# Patient Record
Sex: Male | Born: 1960
Health system: Southern US, Community
[De-identification: ages and names within clinical notes are randomized; demographics above are authoritative.]

## PROBLEM LIST (undated history)

## (undated) DIAGNOSIS — L80 Vitiligo: Secondary | ICD-10-CM

## (undated) DIAGNOSIS — M545 Low back pain: Secondary | ICD-10-CM

## (undated) DIAGNOSIS — K921 Melena: Secondary | ICD-10-CM

## (undated) DIAGNOSIS — R972 Elevated prostate specific antigen [PSA]: Secondary | ICD-10-CM

## (undated) DIAGNOSIS — E7800 Pure hypercholesterolemia, unspecified: Secondary | ICD-10-CM

## (undated) DIAGNOSIS — Z87898 Personal history of other specified conditions: Secondary | ICD-10-CM

## (undated) DIAGNOSIS — L409 Psoriasis, unspecified: Secondary | ICD-10-CM

## (undated) DIAGNOSIS — J309 Allergic rhinitis, unspecified: Secondary | ICD-10-CM

## (undated) DIAGNOSIS — F419 Anxiety disorder, unspecified: Secondary | ICD-10-CM

## (undated) DIAGNOSIS — I10 Essential (primary) hypertension: Secondary | ICD-10-CM

## (undated) DIAGNOSIS — G4700 Insomnia, unspecified: Secondary | ICD-10-CM

## (undated) DIAGNOSIS — Z5189 Encounter for other specified aftercare: Secondary | ICD-10-CM

## (undated) DIAGNOSIS — K219 Gastro-esophageal reflux disease without esophagitis: Secondary | ICD-10-CM

## (undated) DIAGNOSIS — Z87442 Personal history of urinary calculi: Secondary | ICD-10-CM

## (undated) DIAGNOSIS — M1990 Unspecified osteoarthritis, unspecified site: Secondary | ICD-10-CM

## (undated) DIAGNOSIS — I471 Supraventricular tachycardia, unspecified: Secondary | ICD-10-CM

## (undated) DIAGNOSIS — N182 Chronic kidney disease, stage 2 (mild): Secondary | ICD-10-CM

## (undated) HISTORY — PX: SPERMATOCELECTOMY: SHX2420

## (undated) HISTORY — PX: TRANSTHORACIC ECHOCARDIOGRAM: SHX275

## (undated) HISTORY — PX: OTHER SURGICAL HISTORY: SHX169

## (undated) HISTORY — DX: Allergic rhinitis, unspecified: J30.9

## (undated) HISTORY — DX: Personal history of other specified conditions: Z87.898

## (undated) HISTORY — DX: Anxiety disorder, unspecified: F41.9

## (undated) HISTORY — DX: Gastro-esophageal reflux disease without esophagitis: K21.9

## (undated) HISTORY — PX: COLONOSCOPY: SHX174

## (undated) HISTORY — DX: Unspecified osteoarthritis, unspecified site: M19.90

## (undated) HISTORY — DX: Pure hypercholesterolemia, unspecified: E78.00

## (undated) HISTORY — DX: Melena: K92.1

## (undated) HISTORY — DX: Supraventricular tachycardia, unspecified: I47.10

## (undated) HISTORY — DX: Insomnia, unspecified: G47.00

## (undated) HISTORY — DX: Chronic kidney disease, stage 2 (mild): N18.2

## (undated) HISTORY — DX: Psoriasis, unspecified: L40.9

## (undated) HISTORY — DX: Vitiligo: L80

## (undated) HISTORY — DX: Elevated prostate specific antigen (PSA): R97.20

## (undated) HISTORY — DX: Personal history of urinary calculi: Z87.442

## (undated) HISTORY — PX: GANGLION CYST EXCISION: SHX1691

## (undated) HISTORY — DX: Essential (primary) hypertension: I10

## (undated) HISTORY — DX: Encounter for other specified aftercare: Z51.89

## (undated) HISTORY — DX: Low back pain: M54.5

---

## 2006-08-23 ENCOUNTER — Emergency Department (HOSPITAL_COMMUNITY): Admission: EM | Admit: 2006-08-23 | Discharge: 2006-08-23 | Payer: Self-pay | Admitting: Emergency Medicine

## 2006-08-25 ENCOUNTER — Ambulatory Visit (HOSPITAL_COMMUNITY): Admission: AD | Admit: 2006-08-25 | Discharge: 2006-08-25 | Payer: Self-pay | Admitting: Urology

## 2007-01-02 IMAGING — CR DG ABDOMEN 1V
2 series · 2 of 2 positions shown · non-contrast
Comparison: none

HISTORY: Proximal left ureteral calculus

[view not recorded (1 of 2)]
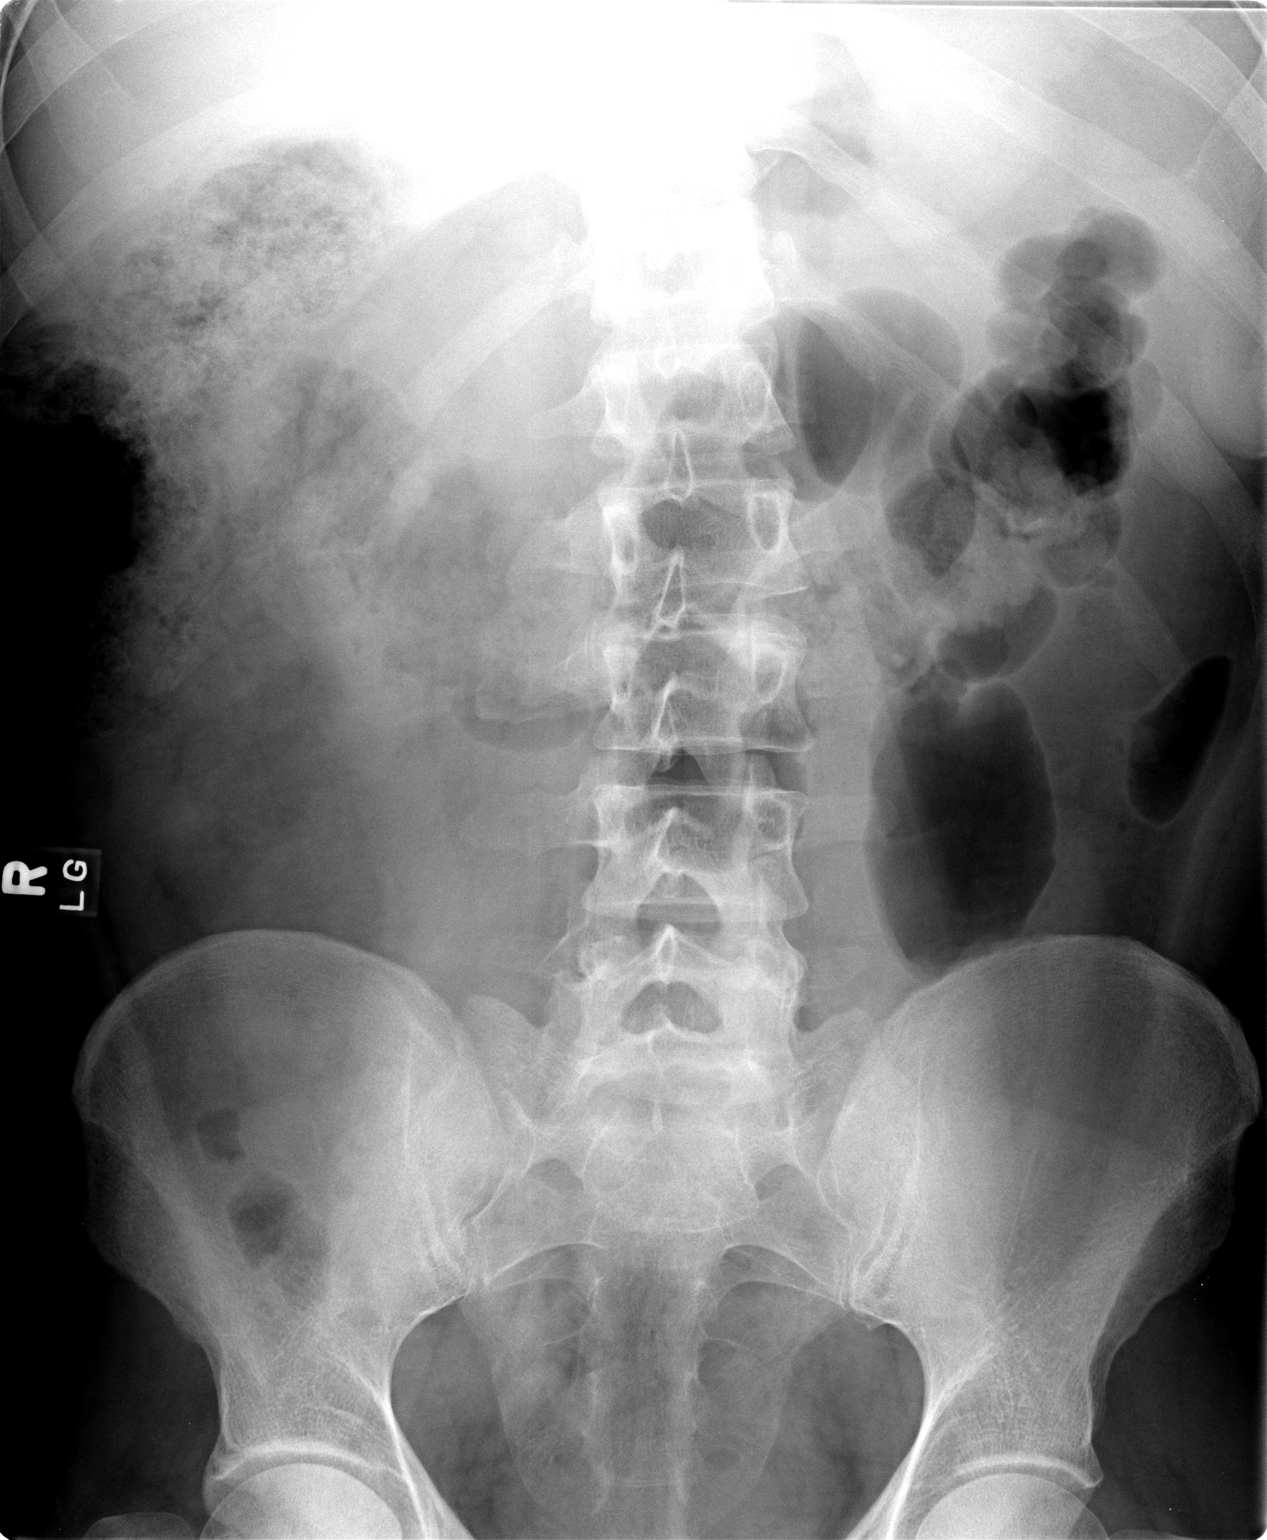

[view not recorded (2 of 2)]
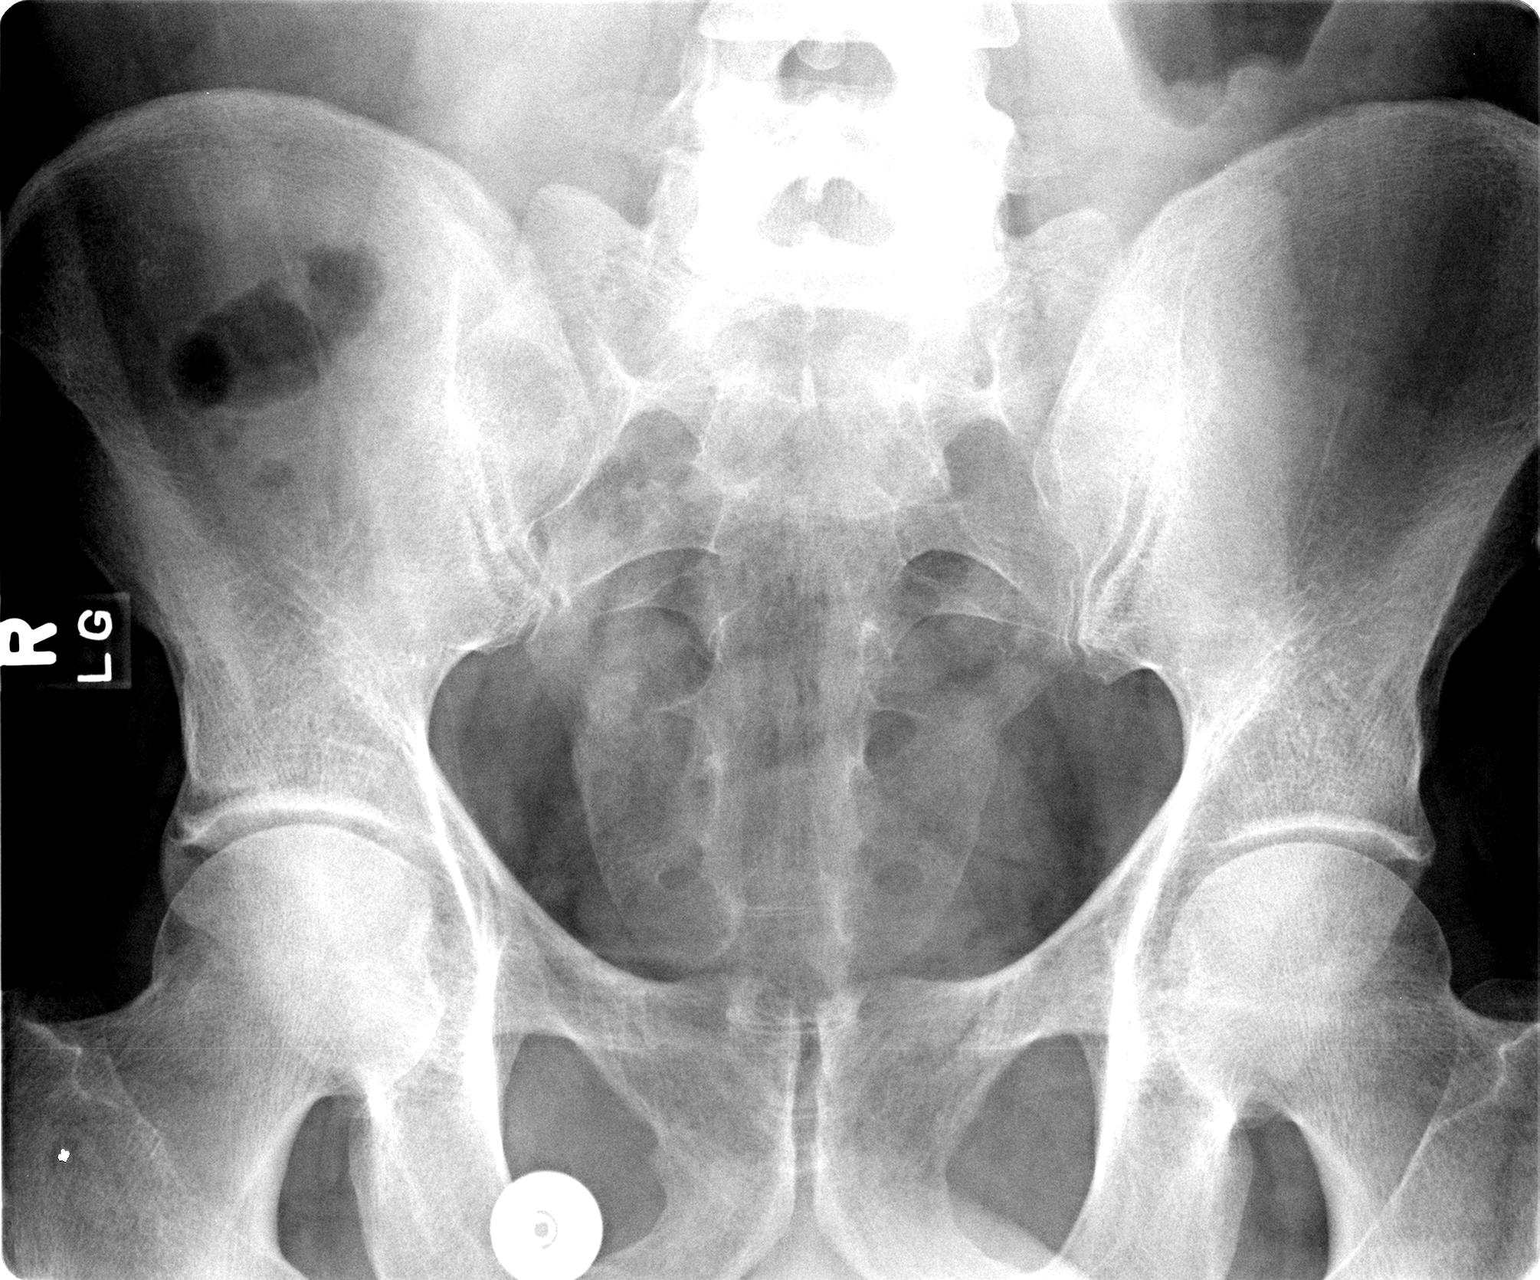

[2 of 2 positions shown; findings below may reference images not displayed]

ABDOMEN ONE VIEW:

3 mm diameter calculus identified adjacent to left transverse process L3
compatible proximal ureteral calculus.
No additional urinary tract calcifications.
Increased stool in proximal half of colon projects over both kidneys.
Minimal broad-based levoconvex scoliosis.
Bowel gas pattern normal.
IMPRESSION: 3 mm diameter left paraspinal calcification at L3 likely proximal left ureteral
calculus.
This roughly corresponds in size and position with calculus identified by
preceding CT of 08/23/2006.

## 2008-06-23 ENCOUNTER — Ambulatory Visit: Payer: Self-pay | Admitting: Internal Medicine

## 2008-06-23 DIAGNOSIS — J309 Allergic rhinitis, unspecified: Secondary | ICD-10-CM | POA: Insufficient documentation

## 2008-06-23 DIAGNOSIS — M25519 Pain in unspecified shoulder: Secondary | ICD-10-CM | POA: Insufficient documentation

## 2008-06-23 DIAGNOSIS — Z87442 Personal history of urinary calculi: Secondary | ICD-10-CM

## 2008-06-23 HISTORY — DX: Personal history of urinary calculi: Z87.442

## 2008-06-23 HISTORY — DX: Allergic rhinitis, unspecified: J30.9

## 2008-06-30 ENCOUNTER — Ambulatory Visit: Payer: Self-pay | Admitting: Internal Medicine

## 2008-06-30 LAB — CONVERTED CEMR LAB
Albumin: 3.9 g/dL (ref 3.5–5.2)
BUN: 15 mg/dL (ref 6–23)
Basophils Relative: 0 % (ref 0.0–3.0)
Cholesterol: 159 mg/dL (ref 0–200)
Creatinine, Ser: 1.2 mg/dL (ref 0.4–1.5)
Eosinophils Absolute: 0.1 10*3/uL (ref 0.0–0.7)
Eosinophils Relative: 2 % (ref 0.0–5.0)
GFR calc Af Amer: 83 mL/min
GFR calc non Af Amer: 69 mL/min
HCT: 45.9 % (ref 39.0–52.0)
HDL: 27.9 mg/dL — ABNORMAL LOW (ref >39.0)
MCHC: 34.6 g/dL (ref 30.0–36.0)
MCV: 88.7 fL (ref 78.0–100.0)
Monocytes Absolute: 0.4 10*3/uL (ref 0.1–1.0)
Platelets: 148 10*3/uL — ABNORMAL LOW (ref 150–400)
RBC: 5.17 M/uL (ref 4.22–5.81)
Specific Gravity, Urine: 1.02
TSH: 1.43 microintl units/mL (ref 0.35–5.50)
Urobilinogen, UA: 0.2
WBC: 4.6 10*3/uL (ref 4.5–10.5)
pH: 6.5

## 2008-07-01 ENCOUNTER — Encounter: Payer: Self-pay | Admitting: Internal Medicine

## 2008-07-08 ENCOUNTER — Ambulatory Visit: Payer: Self-pay | Admitting: Internal Medicine

## 2008-07-08 DIAGNOSIS — Z87898 Personal history of other specified conditions: Secondary | ICD-10-CM

## 2008-07-08 HISTORY — DX: Personal history of other specified conditions: Z87.898

## 2008-09-06 ENCOUNTER — Telehealth: Payer: Self-pay | Admitting: Internal Medicine

## 2009-01-03 ENCOUNTER — Telehealth: Payer: Self-pay | Admitting: Internal Medicine

## 2009-07-11 ENCOUNTER — Ambulatory Visit: Payer: Self-pay | Admitting: Internal Medicine

## 2009-07-11 DIAGNOSIS — J069 Acute upper respiratory infection, unspecified: Secondary | ICD-10-CM | POA: Insufficient documentation

## 2009-08-07 ENCOUNTER — Ambulatory Visit: Payer: Self-pay | Admitting: Family Medicine

## 2009-08-07 DIAGNOSIS — J209 Acute bronchitis, unspecified: Secondary | ICD-10-CM | POA: Insufficient documentation

## 2009-09-21 ENCOUNTER — Encounter: Payer: Self-pay | Admitting: Internal Medicine

## 2009-12-20 ENCOUNTER — Ambulatory Visit: Payer: Self-pay | Admitting: Internal Medicine

## 2009-12-20 LAB — CONVERTED CEMR LAB
Albumin: 4.1 g/dL (ref 3.5–5.2)
Basophils Relative: 0.6 % (ref 0.0–3.0)
Bilirubin Urine: NEGATIVE
Bilirubin, Direct: 0 mg/dL (ref 0.0–0.3)
CO2: 31 meq/L (ref 19–32)
Chloride: 106 meq/L (ref 96–112)
Cholesterol: 183 mg/dL (ref 0–200)
Creatinine, Ser: 1.2 mg/dL (ref 0.4–1.5)
Eosinophils Absolute: 0.1 10*3/uL (ref 0.0–0.7)
Glucose, Urine, Semiquant: NEGATIVE
Ketones, urine, test strip: NEGATIVE
Lymphs Abs: 1.4 10*3/uL (ref 0.7–4.0)
MCHC: 32.5 g/dL (ref 30.0–36.0)
MCV: 89.6 fL (ref 78.0–100.0)
Monocytes Absolute: 0.4 10*3/uL (ref 0.1–1.0)
Neutro Abs: 2.6 10*3/uL (ref 1.4–7.7)
Neutrophils Relative %: 57.6 % (ref 43.0–77.0)
PSA: 1.62 ng/mL (ref 0.10–4.00)
RBC: 5.41 M/uL (ref 4.22–5.81)
Specific Gravity, Urine: 1.01
TSH: 1.22 microintl units/mL (ref 0.35–5.50)
Total CHOL/HDL Ratio: 5
Total Protein: 6.6 g/dL (ref 6.0–8.3)
Triglycerides: 133 mg/dL (ref 0.0–149.0)

## 2010-01-04 ENCOUNTER — Ambulatory Visit: Payer: Self-pay | Admitting: Internal Medicine

## 2010-01-04 DIAGNOSIS — M6740 Ganglion, unspecified site: Secondary | ICD-10-CM | POA: Insufficient documentation

## 2010-01-22 ENCOUNTER — Encounter: Payer: Self-pay | Admitting: Internal Medicine

## 2010-01-31 ENCOUNTER — Encounter (INDEPENDENT_AMBULATORY_CARE_PROVIDER_SITE_OTHER): Payer: Self-pay | Admitting: *Deleted

## 2010-02-02 ENCOUNTER — Ambulatory Visit: Payer: Self-pay | Admitting: Internal Medicine

## 2010-02-02 DIAGNOSIS — H1030 Unspecified acute conjunctivitis, unspecified eye: Secondary | ICD-10-CM | POA: Insufficient documentation

## 2010-02-05 ENCOUNTER — Ambulatory Visit: Payer: Self-pay | Admitting: Gastroenterology

## 2010-02-12 ENCOUNTER — Ambulatory Visit: Payer: Self-pay | Admitting: Gastroenterology

## 2010-02-14 ENCOUNTER — Encounter: Payer: Self-pay | Admitting: Gastroenterology

## 2010-02-16 ENCOUNTER — Ambulatory Visit (HOSPITAL_BASED_OUTPATIENT_CLINIC_OR_DEPARTMENT_OTHER): Admission: RE | Admit: 2010-02-16 | Discharge: 2010-02-16 | Payer: Self-pay | Admitting: Orthopedic Surgery

## 2010-03-19 ENCOUNTER — Encounter: Payer: Self-pay | Admitting: Internal Medicine

## 2010-06-25 ENCOUNTER — Ambulatory Visit: Payer: Self-pay | Admitting: Internal Medicine

## 2010-06-25 DIAGNOSIS — M545 Low back pain, unspecified: Secondary | ICD-10-CM | POA: Insufficient documentation

## 2010-06-25 HISTORY — DX: Low back pain, unspecified: M54.50

## 2010-08-24 ENCOUNTER — Telehealth: Payer: Self-pay | Admitting: Internal Medicine

## 2010-09-18 ENCOUNTER — Encounter: Payer: Self-pay | Admitting: Internal Medicine

## 2011-01-15 NOTE — Progress Notes (Signed)
Summary: refill alprazolam  Phone Note Refill Request Message from:  Fax from Pharmacy on August 24, 2010 11:33 AM  Refills Requested: Medication #1:  ALPRAZOLAM 0.25 MG  TBDP 1 as [facility]  [phone]  [facility]   Method Requested: Telephone to Pharmacy Initial call taken by: Duard Brady LPN,  August 24, 2010 11:35 AM    Prescriptions: ALPRAZOLAM 0.25 MG  TBDP (ALPRAZOLAM) 1 as needed  #30 x 4   Entered by:   Duard Brady LPN   Authorized by:   Gordy Savers  MD   Signed by:   Duard Brady LPN on 69/62/9528   Method used:   Historical   RxID:   [id number]  [facility]

## 2011-01-15 NOTE — Procedures (Signed)
Summary: Colonoscopy  Patient: Todd Huff Note: All result statuses are Final unless otherwise noted.  Tests: (1) Colonoscopy (COL)   COL Colonoscopy           DONE     [facility]     [address]     [place], [place]  [address]           COLONOSCOPY PROCEDURE REPORT           PATIENT:  Todd Huff, Todd Huff  MR#:  [id number]     BIRTHDATE:  Oct 29, 1961, 48 yrs. old  GENDER:  male           ENDOSCOPIST:  Vania Rea. Jarold Motto, MD, [facility]     Referred by:  Eleonore Chiquito, M.D.           PROCEDURE DATE:  02/12/2010     PROCEDURE:  Colonoscopy with snare polypectomy     ASA CLASS:  Class I     INDICATIONS:  Routine Risk Screening           MEDICATIONS:   Fentanyl 100 mcg IV, Versed 8 mg IV           DESCRIPTION OF PROCEDURE:   After the risks benefits and     alternatives of the procedure were thoroughly explained, informed     consent was obtained.  Digital rectal exam was performed and     revealed no abnormalities.   The LB [id number] endoscope     was introduced through the anus and advanced to the cecum, which     was identified by both the appendix and ileocecal valve, without     limitations.  The quality of the prep was excellent, using     MoviPrep.  The instrument was then slowly withdrawn as the colon     was fully examined.     <<PROCEDUREIMAGES>>           FINDINGS:  There were multiple polyps identified and removed.     found scattered throught the colon. They were likely benign and     flat. Polyps were snared without cautery. Retrieval was     successful. snare polyp larger polyp hot snare excised in sigmiod     area.  This was otherwise a normal examination of the colon.     Retroflexed views in the rectum revealed no abnormalities.    The     scope was then withdrawn from the patient and the procedure     completed.           COMPLICATIONS:  None           ENDOSCOPIC IMPRESSION:     1) Polyps, multiple found scattered throught the colon     2)  Otherwise normal examination     RECOMMENDATIONS:     1) If the polyp(s) removed today are proven to be adenomatous     (pre-cancerous) polyps, you will need a repeat colonoscopy in 5     years. Otherwise you should continue to follow colorectal cancer     screening guidelines for "routine risk" patients with colonoscopy     in 10 years.           REPEAT EXAM:  No           ______________________________     Vania Rea. Jarold Motto, MD, [facility]           CC:           n.  eSIGNED:   Vania Rea. Domanic Matusek at 02/12/2010 09:25 AM           Juventino Slovak, [phone]  Note: An exclamation mark (!) indicates a result that was not dispersed into the flowsheet. Document Creation Date: 02/12/2010 9:26 AM _______________________________________________________________________  (1) Order result status: Final Collection or observation date-time: 02/12/2010 09:19 Requested date-time:  Receipt date-time:  Reported date-time:  Referring Physician:   Ordering Physician: Sheryn Bison [id number]) Specimen Source:  Source: Launa Grill Order Number: [id number] Lab site:   Appended Document: Colonoscopy     Procedures Next Due Date:    Colonoscopy: 02/2015

## 2011-01-15 NOTE — Letter (Signed)
Summary: [facility]  [facility]   Imported By: Maryln Gottron 03/28/2010 12:56:01  _____________________________________________________________________  External Attachment:    Type:   Image     Comment:   External Document

## 2011-01-15 NOTE — Letter (Signed)
Summary: [facility] Instructions  [facility]  [address] [place], [place] [address]   Phone: [phone]  Fax: [phone]       Todd Huff    09/19/1961    MRN: [id number]        Procedure Day Dorna Bloom:  Duanne Limerick  02/12/10     Arrival Time:  8:00AM     Procedure Time:  9:00AM     Location of Procedure:                    Juliann Pares  [facility] (4th Floor)                        PREPARATION FOR COLONOSCOPY WITH MOVIPREP   Starting 5 days prior to your procedure 02/07/10 do not eat nuts, seeds, popcorn, corn, beans, peas,  salads, or any raw vegetables.  Do not take any fiber supplements (e.g. Metamucil, Citrucel, and Benefiber).  THE DAY BEFORE YOUR PROCEDURE         DATE: 02/11/10  DAY: SUNDAY  1.  Drink clear liquids the entire day-NO SOLID FOOD  2.  Do not drink anything colored red or purple.  Avoid juices with pulp.  No orange juice.  3.  Drink at least 64 oz. (8 glasses) of fluid/clear liquids during the day to prevent dehydration and help the prep work efficiently.  CLEAR LIQUIDS INCLUDE: Water Jello Ice Popsicles Tea (sugar ok, no milk/cream) Powdered fruit flavored drinks Coffee (sugar ok, no milk/cream) Gatorade Juice: apple, white grape, white cranberry  Lemonade Clear bullion, consomm, broth Carbonated beverages (any kind) Strained chicken noodle soup Hard Candy                             4.  In the morning, mix first dose of MoviPrep solution:    Empty 1 Pouch A and 1 Pouch B into the disposable container    Add lukewarm drinking water to the top line of the container. Mix to dissolve    Refrigerate (mixed solution should be used within 24 hrs)  5.  Begin drinking the prep at 5:00 p.m. The MoviPrep container is divided by 4 marks.   Every 15 minutes drink the solution down to the next mark (approximately 8 oz) until the full liter is complete.   6.  Follow completed prep with 16 oz of clear liquid of your choice (Nothing  red or purple).  Continue to drink clear liquids until bedtime.  7.  Before going to bed, mix second dose of MoviPrep solution:    Empty 1 Pouch A and 1 Pouch B into the disposable container    Add lukewarm drinking water to the top line of the container. Mix to dissolve    Refrigerate  THE DAY OF YOUR PROCEDURE      DATE: 02/12/10  DAY: MONDAY Beginning at 4:00AM (5 hours before procedure):         1. Every 15 minutes, drink the solution down to the next mark (approx 8 oz) until the full liter is complete.  2. Follow completed prep with 16 oz. of clear liquid of your choice.    3. You may drink clear liquids until 7:00AM (2 HOURS BEFORE PROCEDURE).   MEDICATION INSTRUCTIONS  Unless otherwise instructed, you should take regular prescription medications with a small sip of water   as early as possible the morning of  your procedure.       Additional medication instructions: n/a         OTHER INSTRUCTIONS  You will need a responsible adult at least 52 years of age to accompany you and drive you home.   This person must remain in the waiting room during your procedure.  Wear loose fitting clothing that is easily removed.  Leave jewelry and other valuables at home.  However, you may wish to bring a book to read or  an iPod/MP3 player to listen to music as you wait for your procedure to start.  Remove all body piercing jewelry and leave at home.  Total time from sign-in until discharge is approximately 2-3 hours.  You should go home directly after your procedure and rest.  You can resume normal activities the  day after your procedure.  The day of your procedure you should not:   Drive   Make legal decisions   Operate machinery   Drink alcohol   Return to work  You will receive specific instructions about eating, activities and medications before you leave.    The above instructions have been reviewed and explained to me by   Sherren Kerns RN  February 05, 2010 4:22 PM     I fully understand and can verbalize these instructions _____________________________ Date _________

## 2011-01-15 NOTE — Miscellaneous (Signed)
Summary: previsit/rm  Clinical Lists Changes  Medications: Added new medication of MOVIPREP 100 GM  SOLR (PEG-KCL-NACL-NASULF-NA ASC-C) As per prep instructions. - Signed Rx of MOVIPREP 100 GM  SOLR (PEG-KCL-NACL-NASULF-NA ASC-C) As per prep instructions.;  #1 x 0;  Signed;  Entered by: Sherren Kerns RN;  Authorized by: Mardella Layman MD [facility];  Method used: Electronically to [facility] [address] [place], [place], [place]  [address], Ph: [phone], Fax: [phone] Observations: Added new observation of ALLERGY REV: Done (02/05/2010 16:06)    Prescriptions: MOVIPREP 100 GM  SOLR (PEG-KCL-NACL-NASULF-NA ASC-C) As per prep instructions.  #1 x 0   Entered by:   Sherren Kerns RN   Authorized by:   Mardella Layman MD [facility]   Signed by:   Sherren Kerns RN on 02/05/2010   Method used:   Electronically to        [facility] (retail)       [address]       [place], [place]  [address]       Ph: [phone]       Fax: [phone]   RxID:   [id number]

## 2011-01-15 NOTE — Consult Note (Signed)
Summary: [facility]  [facility] [place]   Imported By: Maryln Gottron 01/29/2010 15:33:50  _____________________________________________________________________  External Attachment:    Type:   Image     Comment:   External Document

## 2011-01-15 NOTE — Assessment & Plan Note (Signed)
Summary: foreign body in eye?dm   Vital Signs:  Patient profile:   50 year old male Weight:      200 pounds Temp:     98.5 degrees F oral BP sitting:   114 / 80  (left arm) Cuff size:   regular  Vitals Entered By: Duard Brady LPN (February 02, 2010 9:53 AM) CC: c/o left eye irratation and drainage since yesterday Is Patient Diabetic? No   CC:  c/o left eye irratation and drainage since yesterday.  History of Present Illness: 50 year old patient, who presents with a two day history of irritation and redness involving his left eye.  No history of obvious foreign body exposure. no change in visual acuity.  he feels that the redness and irritation may be slightly improved.  No fever or URI symptoms.  No prior history of conjunctivitis uveitis, etc.    Preventive Screening-Counseling & Management  Alcohol-Tobacco     Smoking Status: never  Allergies (verified): No Known Drug Allergies  Past History:  Past Medical History: Reviewed history from 06/23/2008 and no changes required. performance  anxiety vitiligo left shoulder pain family history of colonic polyps Nephrolithiasis, hx of Allergic rhinitis  Review of Systems  The patient denies anorexia, fever, weight loss, weight gain, vision loss, decreased hearing, hoarseness, chest pain, syncope, dyspnea on exertion, peripheral edema, prolonged cough, headaches, hemoptysis, abdominal pain, melena, hematochezia, severe indigestion/heartburn, hematuria, incontinence, genital sores, muscle weakness, suspicious skin lesions, transient blindness, difficulty walking, depression, unusual weight change, abnormal bleeding, enlarged lymph nodes, angioedema, breast masses, and testicular masses.    Physical Exam  General:  Well-developed,well-nourished,in no acute distress; alert,appropriate and cooperative throughout examination Head:  Normocephalic and atraumatic without obvious abnormalities. No apparent alopecia or  balding. Eyes:  slight conjunctiva injection on the left.  No obvious foreign body noted   Impression & Recommendations:  Problem # 1:  CONJUNCTIVITIS, ACUTE (ICD-372.00)  His updated medication list for this problem includes:    Tobrasol 0.3 % Soln (Tobramycin sulfate) .Marland Kitchen... 2 gtts os qid and the patient has been asked to gently irrigate his left side to possibly flush out  A small foreign body  Complete Medication List: 1)  Alprazolam 0.25 Mg Tbdp (Alprazolam) .Marland Kitchen.. 1 as needed 2)  Propranolol Hcl 10 Mg Tabs (Propranolol hcl) .Marland Kitchen.. 1 as needed 3)  Zyrtec Allergy 10 Mg Tabs (Cetirizine hcl) .Marland Kitchen.. 1 once daily 4)  Tobrasol 0.3 % Soln (Tobramycin sulfate) .... 2 gtts os qid and  Patient Instructions: 1)  irrigate left eye 2)  use eyedrops as directed 3)  call if the redness or irritation does not promptly improve Prescriptions: TOBRASOL 0.3 % SOLN (TOBRAMYCIN SULFATE) 2 gtts OS QID and  #10 cc x 0   Entered and Authorized by:   Gordy Savers  MD   Signed by:   Gordy Savers  MD on 02/02/2010   Method used:   Print then Give to Patient   RxID:   [id number]

## 2011-01-15 NOTE — Assessment & Plan Note (Signed)
Summary: cpx/njr WIFE RSC/NJR   Vital Signs:  Patient profile:   50 year old male Height:      70 inches Weight:      195 pounds BMI:     28.08 BP sitting:   102 / 64  (left arm) Cuff size:   regular  Vitals Entered By: Raechel Ache, RN (January 04, 2010 3:14 PM) CC: CPX, labs done. Is Patient Diabetic? No   CC:  CPX and labs done.Marland Kitchen  History of Present Illness: 50 year old patient seen today for a wellness exam is done quite well.  His long history of a ganglion involving his left dorsal wrist which has become larger and much more painful  Allergies: No Known Drug Allergies  Past History:  Past Medical History: Reviewed history from 06/23/2008 and no changes required. performance  anxiety vitiligo left shoulder pain family history of colonic polyps Nephrolithiasis, hx of Allergic rhinitis  Past Surgical History: Reviewed history from 06/23/2008 and no changes required. lithotripsy October 2007 spermatocele resection approximately  10 years ago  Family History: Reviewed history from 06/23/2008 and no changes required. father is 15, history of hypercholesterolemia, colonic polyps  mother, age 78-history of hypertension  one sister is well  Social History: Reviewed history from 06/23/2008 and no changes required. Married Never Smoked two sons, age 25 and 44 two stepdaughters aged 20 and 26 college graduate- NA Product manager  Review of Systems  The patient denies anorexia, fever, weight loss, weight gain, vision loss, decreased hearing, hoarseness, chest pain, syncope, dyspnea on exertion, peripheral edema, prolonged cough, headaches, hemoptysis, abdominal pain, melena, hematochezia, severe indigestion/heartburn, hematuria, incontinence, genital sores, muscle weakness, suspicious skin lesions, transient blindness, difficulty walking, depression, unusual weight change, abnormal bleeding, enlarged lymph nodes, angioedema, breast masses, and testicular masses.      Physical Exam  General:  Well-developed,well-nourished,in no acute distress; alert,appropriate and cooperative throughout examination Head:  Normocephalic and atraumatic without obvious abnormalities. No apparent alopecia or balding. Eyes:  No corneal or conjunctival inflammation noted. EOMI. Perrla. Funduscopic exam benign, without hemorrhages, exudates or papilledema. Vision grossly normal. Ears:  External ear exam shows no significant lesions or deformities.  Otoscopic examination reveals clear canals, tympanic membranes are intact bilaterally without bulging, retraction, inflammation or discharge. Hearing is grossly normal bilaterally. Nose:  External nasal examination shows no deformity or inflammation. Nasal mucosa are pink and moist without lesions or exudates. Mouth:  Oral mucosa and oropharynx without lesions or exudates.  Teeth in good repair. Neck:  No deformities, masses, or tenderness noted. Chest Wall:  No deformities, masses, tenderness or gynecomastia noted. Breasts:  No masses or gynecomastia noted Lungs:  Normal respiratory effort, chest expands symmetrically. Lungs are clear to auscultation, no crackles or wheezes. Heart:  Normal rate and regular rhythm. S1 and S2 normal without gallop, murmur, click, rub or other extra sounds. Abdomen:  Bowel sounds positive,abdomen soft and non-tender without masses, organomegaly or hernias noted. Rectal:  No external abnormalities noted. Normal sphincter tone. No rectal masses or tenderness. Genitalia:  Testes bilaterally descended without nodularity, tenderness or masses. No scrotal masses or lesions. No penis lesions or urethral discharge. Prostate:  Prostate gland firm and smooth, no enlargement, nodularity, tenderness, mass, asymmetry or induration. Msk:  No deformity or scoliosis noted of thoracic or lumbar spine.   Pulses:  R and L carotid,radial,femoral,dorsalis pedis and posterior tibial pulses are full and equal  bilaterally Extremities:  No clubbing, cyanosis, edema, or deformity noted with normal full range of motion of  all joints.   Neurologic:  alert & oriented X3, cranial nerves II-XII intact, strength normal in all extremities, and gait normal.  alert & oriented X3, cranial nerves II-XII intact, strength normal in all extremities, and gait normal.   Skin:  Intact without suspicious lesions or rashes Cervical Nodes:  No lymphadenopathy noted Axillary Nodes:  No palpable lymphadenopathy Inguinal Nodes:  No significant adenopathy Psych:  Cognition and judgment appear intact. Alert and cooperative with normal attention span and concentration. No apparent delusions, illusions, hallucinations   Impression & Recommendations:  Problem # 1:  PREVENTIVE HEALTH CARE (ICD-V70.0)  Orders: Gastroenterology Referral (GI)  Complete Medication List: 1)  Alprazolam 0.25 Mg Tbdp (Alprazolam) .Marland Kitchen.. 1 as needed 2)  Propranolol Hcl 10 Mg Tabs (Propranolol hcl) .Marland Kitchen.. 1 as needed 3)  Zyrtec Allergy 10 Mg Tabs (Cetirizine hcl) .Marland Kitchen.. 1 once daily  Other Orders: Orthopedic Surgeon Referral (Ortho Surgeon)  Patient Instructions: 1)  Please schedule a follow-up appointment in 1 year. 2)  It is important that you exercise regularly at least 20 minutes 5 times a week. If you develop chest pain, have severe difficulty breathing, or feel very tired , stop exercising immediately and seek medical attention. 3)  Schedule a colonoscopy/sigmoidoscopy to help detect colon cancer. Prescriptions: ZYRTEC ALLERGY 10 MG  TABS (CETIRIZINE HCL) 1 once daily  #30 x 4   Entered and Authorized by:   Gordy Savers  MD   Signed by:   Gordy Savers  MD on 01/04/2010   Method used:   Print then Give to Patient   RxID:   [id number] PROPRANOLOL HCL 10 MG  TABS (PROPRANOLOL HCL) 1 as needed  #30 x 4   Entered and Authorized by:   Gordy Savers  MD   Signed by:   Gordy Savers  MD on 01/04/2010   Method used:    Print then Give to Patient   RxID:   [id number] ALPRAZOLAM 0.25 MG  TBDP (ALPRAZOLAM) 1 as needed  #30 x 4   Entered and Authorized by:   Gordy Savers  MD   Signed by:   Gordy Savers  MD on 01/04/2010   Method used:   Print then Give to Patient   RxID:   [id number]

## 2011-01-15 NOTE — Miscellaneous (Signed)
Summary: Flu shot/[facility]  Flu shot/[facility]   Imported By: Maryln Gottron 10/08/2010 11:14:06  _____________________________________________________________________  External Attachment:    Type:   Image     Comment:   External Document

## 2011-01-15 NOTE — Assessment & Plan Note (Signed)
Summary: acute onset of back pain and weakness/dm   Vital Signs:  Patient profile:   50 year old male Weight:      204 pounds Temp:     97.5 degrees F oral BP sitting:   114 / 70  (left arm) Cuff size:   regular  Vitals Entered By: Duard Brady LPN (June 25, 2010 9:38 AM) CC: c/o low back pain - OTC ibuprofen not helping.  Is Patient Diabetic? No   CC:  c/o low back pain - OTC ibuprofen not helping. Marland Kitchen  History of Present Illness: 50 year old patient has long history of intermittent low back pain.  An approximate two or 3 weeks ago while playing golf.  He had a flare of low back pain and was unable to finish his round of golf.  His low back pain generally is in the right lumbar area with occasional radiation to the right hip.  While at church yesterday.  He had the onset of worsening right low back pain.  There again is some mild radiation down the right hip region.  Denies any motor weakness  Preventive Screening-Counseling & Management  Alcohol-Tobacco     Smoking Status: never  Allergies (verified): No Known Drug Allergies  Past History:  Past Medical History: performance  anxiety vitiligo left shoulder pain family history of colonic polyps Nephrolithiasis, hx of Allergic rhinitis Low back pain  Past Surgical History: Reviewed history from 06/23/2008 and no changes required. lithotripsy October 2007 spermatocele resection approximately  10 years ago  Review of Systems       The patient complains of difficulty walking.  The patient denies anorexia, fever, weight loss, weight gain, vision loss, decreased hearing, hoarseness, chest pain, syncope, dyspnea on exertion, peripheral edema, prolonged cough, headaches, hemoptysis, abdominal pain, melena, hematochezia, severe indigestion/heartburn, hematuria, incontinence, genital sores, muscle weakness, suspicious skin lesions, transient blindness, depression, unusual weight change, abnormal bleeding, enlarged lymph  nodes, angioedema, breast masses, and testicular masses.    Physical Exam  General:  Well-developed,well-nourished,in no acute distress; alert,appropriate and cooperative throughout examination Msk:  no tenderness in the lumbar area straight leg testing  negative Neurologic:  able to walk on his toes and heels without difficulty patellar and Achilles reflexes normal   Impression & Recommendations:  Problem # 1:  LOW BACK PAIN (ICD-724.2)  His updated medication list for this problem includes:    Hydrocodone-acetaminophen 5-500 Mg Tabs (Hydrocodone-acetaminophen) ..... One or two every 6 hours as needed for pain    Meloxicam 15 Mg Tabs (Meloxicam) ..... One daily, as needed for low back pain    Cyclobenzaprine Hcl 10 Mg Tabs (Cyclobenzaprine hcl) ..... One every 8 hours as needed for low back pain  His updated medication list for this problem includes:    Hydrocodone-acetaminophen 5-500 Mg Tabs (Hydrocodone-acetaminophen) ..... One or two every 6 hours as needed for pain    Meloxicam 15 Mg Tabs (Meloxicam) ..... One daily, as needed for low back pain    Cyclobenzaprine Hcl 10 Mg Tabs (Cyclobenzaprine hcl) ..... One every 8 hours as needed for low back pain  Orders: Prescription Created Electronically [phone])  Complete Medication List: 1)  Alprazolam 0.25 Mg Tbdp (Alprazolam) .Marland Kitchen.. 1 as needed 2)  Propranolol Hcl 10 Mg Tabs (Propranolol hcl) .Marland Kitchen.. 1 as needed 3)  Zyrtec Allergy 10 Mg Tabs (Cetirizine hcl) .Marland Kitchen.. 1 once daily 4)  Tobrasol 0.3 % Soln (Tobramycin sulfate) .... 2 gtts os qid and 5)  Hydrocodone-acetaminophen 5-500 Mg Tabs (Hydrocodone-acetaminophen) .... One or  two every 6 hours as needed for pain 6)  Meloxicam 15 Mg Tabs (Meloxicam) .... One daily, as needed for low back pain 7)  Cyclobenzaprine Hcl 10 Mg Tabs (Cyclobenzaprine hcl) .... One every 8 hours as needed for low back pain  Patient Instructions: 1)  Most patients (90%) with low back pain will improve with time  (2-6 weeks). Keep active but avoid activities that are painful. Apply moist heat and/or ice to lower back several times a day. Prescriptions: CYCLOBENZAPRINE HCL 10 MG TABS (CYCLOBENZAPRINE HCL) one every 8 hours as needed for low back pain  #30 x 4   Entered and Authorized by:   Gordy Savers  MD   Signed by:   Gordy Savers  MD on 06/25/2010   Method used:   Print then Give to Patient   RxID:   [id number] MELOXICAM 15 MG TABS (MELOXICAM) one daily, as needed for low back pain  #30 x 4   Entered and Authorized by:   Gordy Savers  MD   Signed by:   Gordy Savers  MD on 06/25/2010   Method used:   Print then Give to Patient   RxID:   [id number] HYDROCODONE-ACETAMINOPHEN 5-500 MG TABS (HYDROCODONE-ACETAMINOPHEN) one or two every 6 hours as needed for pain  #50 x 0   Entered and Authorized by:   Gordy Savers  MD   Signed by:   Gordy Savers  MD on 06/25/2010   Method used:   Print then Give to Patient   RxID:   [id number] CYCLOBENZAPRINE HCL 10 MG TABS (CYCLOBENZAPRINE HCL) one every 8 hours as needed for low back pain  #30 x 4   Entered and Authorized by:   Gordy Savers  MD   Signed by:   Gordy Savers  MD on 06/25/2010   Method used:   Electronically to        [facility]* (retail)       [address]       [place], [place]  [address]       Ph: [phone]       Fax: [phone]   RxID:   [id number] MELOXICAM 15 MG TABS (MELOXICAM) one daily, as needed for low back pain  #30 x 4   Entered and Authorized by:   Gordy Savers  MD   Signed by:   Gordy Savers  MD on 06/25/2010   Method used:   Electronically to        [facility]* (retail)       [address]       [place], [place]  [address]       Ph: [phone]       Fax: [phone]   RxID:   [id number]   Appended Document: acute onset of back pain and  weakness/dm     Allergies: No Known Drug Allergies   Complete Medication List: 1)  Alprazolam 0.25 Mg Tbdp (Alprazolam) .Marland Kitchen.. 1 as needed 2)  Propranolol Hcl 10 Mg Tabs (Propranolol hcl) .Marland Kitchen.. 1 as needed 3)  Zyrtec Allergy 10 Mg Tabs (Cetirizine hcl) .Marland Kitchen.. 1 once daily 4)  Tobrasol 0.3 % Soln (Tobramycin sulfate) .... 2 gtts os qid and 5)  Hydrocodone-acetaminophen 5-500 Mg Tabs (Hydrocodone-acetaminophen) .... One or two every 6 hours as needed for pain 6)  Meloxicam 15 Mg Tabs (Meloxicam) .Marland KitchenMarland KitchenMarland Kitchen  One daily, as needed for low back pain 7)  Cyclobenzaprine Hcl 10 Mg Tabs (Cyclobenzaprine hcl) .... One every 8 hours as needed for low back pain  Other Orders: Depo- Medrol 80mg  (J1040) Admin of Therapeutic Inj  intramuscular or subcutaneous ([address])    Medication Administration  Injection # 1:    Medication: Depo- Medrol 80mg     Diagnosis: LOW BACK PAIN (ICD-724.2)    Route: IM    Site: RUOQ gluteus    Exp Date: 03/2013    Lot #: [id number]    Mfr: Pharmacia    Patient tolerated injection without complications    Given by: Duard Brady LPN (June 25, 2010 10:30 AM)  Orders Added: 1)  Depo- Medrol 80mg  [J1040] 2)  Admin of Therapeutic Inj  intramuscular or subcutaneous [[address]]

## 2011-01-15 NOTE — Letter (Signed)
Summary: Patient Notice- Polyp Results  [facility]  [address] [place], [place] [address]   Phone: [phone]  Fax: [phone]        February 14, 2010 MRN: [id number]    Todd Huff [address] [place], [place]  [address]    Dear Mr. [name],  I am pleased to inform you that the colon polyp(s) removed during your recent colonoscopy was (were) found to be benign (no cancer detected) upon pathologic examination.  I recommend you have a repeat colonoscopy examination in 5_ years to look for recurrent polyps, as having colon polyps increases your risk for having recurrent polyps or even colon cancer in the future.  Should you develop new or worsening symptoms of abdominal pain, bowel habit changes or bleeding from the rectum or bowels, please schedule an evaluation with either your primary care physician or with me.  Additional information/recommendations:  xx__ No further action with gastroenterology is needed at this time. Please      follow-up with your primary care physician for your other healthcare      needs.  __ Please call [phone] to schedule a return visit to review your      situation.  __ Please keep your follow-up visit as already scheduled.  __ Continue treatment plan as outlined the day of your exam.  Please call us if you are having persistent problems or have questions about your condition that have not been fully answered at this time.  Sincerely,  Mardella Layman MD [facility]  This letter has been electronically signed by your physician.  Appended Document: Patient Notice- Polyp Results letter mailed 3.3.11

## 2011-02-19 ENCOUNTER — Other Ambulatory Visit: Payer: Self-pay | Admitting: Internal Medicine

## 2011-02-28 ENCOUNTER — Telehealth: Payer: Self-pay

## 2011-02-28 ENCOUNTER — Other Ambulatory Visit: Payer: Self-pay

## 2011-02-28 MED ORDER — OSELTAMIVIR PHOSPHATE 75 MG PO CAPS: 75.0000 mg | ORAL_CAPSULE | Freq: Two times a day (BID) | ORAL | Status: AC

## 2011-02-28 NOTE — Telephone Encounter (Signed)
Called [facility] to make sure rx was at pharmacy - they have recv'd kik

## 2011-03-10 LAB — POCT HEMOGLOBIN-HEMACUE: Hemoglobin: 17.4 g/dL — ABNORMAL HIGH (ref 13.0–17.0)

## 2011-03-27 ENCOUNTER — Other Ambulatory Visit (INDEPENDENT_AMBULATORY_CARE_PROVIDER_SITE_OTHER): Payer: BLUE CROSS/BLUE SHIELD | Admitting: Internal Medicine

## 2011-03-27 DIAGNOSIS — Z0000 Encounter for general adult medical examination without abnormal findings: Secondary | ICD-10-CM

## 2011-03-27 LAB — BASIC METABOLIC PANEL
Calcium: 8.8 mg/dL (ref 8.4–10.5)
Creatinine, Ser: 1.2 mg/dL (ref 0.4–1.5)
GFR: 65.24 mL/min (ref >60.00)

## 2011-03-27 LAB — LIPID PANEL
Cholesterol: 176 mg/dL (ref 0–200)
Triglycerides: 109 mg/dL (ref 0.0–149.0)
VLDL: 21.8 mg/dL (ref 0.0–40.0)

## 2011-03-27 LAB — POCT URINALYSIS DIPSTICK
Bilirubin, UA: NEGATIVE
Blood, UA: NEGATIVE
Glucose, UA: NEGATIVE
Ketones, UA: NEGATIVE
Leukocytes, UA: NEGATIVE
Nitrite, UA: NEGATIVE

## 2011-03-27 LAB — CBC WITH DIFFERENTIAL/PLATELET
Basophils Absolute: 0 10*3/uL (ref 0.0–0.1)
Eosinophils Absolute: 0.1 10*3/uL (ref 0.0–0.7)
Eosinophils Relative: 2.1 % (ref 0.0–5.0)
Lymphocytes Relative: 31.4 % (ref 12.0–46.0)
Lymphs Abs: 1.6 10*3/uL (ref 0.7–4.0)
Monocytes Absolute: 0.4 10*3/uL (ref 0.1–1.0)
Neutro Abs: 2.9 10*3/uL (ref 1.4–7.7)
Neutrophils Relative %: 58.8 % (ref 43.0–77.0)
RBC: 5.24 Mil/uL (ref 4.22–5.81)
WBC: 5 10*3/uL (ref 4.5–10.5)

## 2011-03-27 LAB — HEPATIC FUNCTION PANEL
AST: 18 U/L (ref 0–37)
Albumin: 4 g/dL (ref 3.5–5.2)

## 2011-03-27 LAB — PSA: PSA: 1.49 ng/mL (ref 0.10–4.00)

## 2011-04-03 ENCOUNTER — Encounter: Payer: Self-pay | Admitting: Internal Medicine

## 2011-04-04 ENCOUNTER — Encounter: Payer: Self-pay | Admitting: Internal Medicine

## 2011-04-09 ENCOUNTER — Ambulatory Visit (INDEPENDENT_AMBULATORY_CARE_PROVIDER_SITE_OTHER): Payer: BLUE CROSS/BLUE SHIELD | Admitting: Internal Medicine

## 2011-04-09 ENCOUNTER — Encounter: Payer: Self-pay | Admitting: Internal Medicine

## 2011-04-09 DIAGNOSIS — M545 Low back pain, unspecified: Secondary | ICD-10-CM

## 2011-04-09 DIAGNOSIS — Z0000 Encounter for general adult medical examination without abnormal findings: Secondary | ICD-10-CM

## 2011-04-09 DIAGNOSIS — Z87442 Personal history of urinary calculi: Secondary | ICD-10-CM

## 2011-04-09 MED ORDER — ALPRAZOLAM 0.25 MG PO TABS: 0.2500 mg | ORAL_TABLET | ORAL | Status: DC | PRN

## 2011-04-09 NOTE — Progress Notes (Signed)
Subjective:    Patient ID: Todd Huff, male    DOB: 08-14-1961, 50 y.o.   MRN: [id number]  HPI  50 year old patient who is seen today for a wellness exam. He is doing quite well. Medical regimen reviewed. He does have a history of mild asymptomatic BPH nephrolithiasis and occasional low back pain. His allergic rhinitis has been well controlled.   Patient profile: 50 year old male  Height: 70 inches  Weight: 195 pounds  BMI: 28.08  BP sitting: 102 / 64 (left arm)  Cuff size: regular  Vitals Entered By: Raechel Ache, RN (January 04, 2010 3:14 PM)  CC: CPX, labs done.  Is Patient Diabetic? No  CC: CPX and labs done.Marland Kitchen  History of Present Illness:  50 year old patient seen today for a wellness exam is done quite well. His long history of a ganglion involving his left dorsal wrist which has become larger and much more painful  Allergies:  No Known Drug Allergies  Past History:  Past Medical History:  Reviewed history from 06/23/2008 and no changes required.  performance anxiety  vitiligo  left shoulder pain  family history of colonic polyps  Nephrolithiasis, hx of  Allergic rhinitis  Past Surgical History:  Reviewed history from 06/23/2008 and no changes required.  lithotripsy October 2007  spermatocele resection approximately 10 years ago  Family History:  Reviewed history from 06/23/2008 and no changes required.  father is 41, history of hypercholesterolemia, colonic polyps  mother, age 54-history of hypertension  one sister is well  Social History:  Reviewed history from 06/23/2008 and no changes required.  Married  Never Smoked  two sons, age 34 and 32  two stepdaughters aged 88 and 47  college graduate- NA Product manager   Past Medical History  Diagnosis Date  . ALLERGIC RHINITIS 06/23/2008  . BENIGN PROSTATIC HYPERTROPHY, HX OF 07/08/2008  . GANGLION CYST, WRIST, LEFT 01/04/2010  . LOW BACK PAIN 06/25/2010  . NEPHROLITHIASIS, HX OF 06/23/2008   Past Surgical History   Procedure Date  . Spermatocelectomy     reports that he has never smoked. He has never used smokeless tobacco. He reports that he does not drink alcohol or use illicit drugs. family history is not on file. No Known Allergies    Review of Systems  Constitutional: Negative for fever, chills, activity change, appetite change and fatigue.  HENT: Negative for hearing loss, ear pain, congestion, rhinorrhea, sneezing, mouth sores, trouble swallowing, neck pain, neck stiffness, dental problem, voice change, sinus pressure and tinnitus.   Eyes: Negative for photophobia, pain, redness and visual disturbance.  Respiratory: Negative for apnea, cough, choking, chest tightness, shortness of breath and wheezing.   Cardiovascular: Negative for chest pain, palpitations and leg swelling.  Gastrointestinal: Negative for nausea, vomiting, abdominal pain, diarrhea, constipation, blood in stool, abdominal distention, anal bleeding and rectal pain.  Genitourinary: Negative for dysuria, urgency, frequency, hematuria, flank pain, decreased urine volume, discharge, penile swelling, scrotal swelling, difficulty urinating, genital sores and testicular pain.  Musculoskeletal: Negative for myalgias, back pain, joint swelling, arthralgias and gait problem.  Skin: Negative for color change, rash and wound.  Neurological: Negative for dizziness, tremors, seizures, syncope, facial asymmetry, speech difficulty, weakness, light-headedness, numbness and headaches.  Hematological: Negative for adenopathy. Does not bruise/bleed easily.  Psychiatric/Behavioral: Negative for suicidal ideas, hallucinations, behavioral problems, confusion, sleep disturbance, self-injury, dysphoric mood, decreased concentration and agitation. The patient is not nervous/anxious.        Objective:   Physical Exam  Constitutional: He  appears well-developed and well-nourished.  HENT:  Head: Normocephalic and atraumatic.  Right Ear: External ear  normal.  Left Ear: External ear normal.  Nose: Nose normal.  Mouth/Throat: Oropharynx is clear and moist.  Eyes: Conjunctivae and EOM are normal. Pupils are equal, round, and reactive to light. No scleral icterus.  Neck: Normal range of motion. Neck supple. No JVD present. No thyromegaly present.  Cardiovascular: Regular rhythm, normal heart sounds and intact distal pulses.  Exam reveals no gallop and no friction rub.   No murmur heard. Pulmonary/Chest: Effort normal and breath sounds normal. He exhibits no tenderness.  Abdominal: Soft. Bowel sounds are normal. He exhibits no distension and no mass. There is no tenderness.  Genitourinary: Penis normal.       Prostate +2 enlarged symmetrical  Musculoskeletal: Normal range of motion. He exhibits no edema and no tenderness.  Lymphadenopathy:    He has no cervical adenopathy.  Neurological: He is alert. He has normal reflexes. No cranial nerve deficit. Coordination normal.  Skin: Skin is warm and dry. No rash noted.  Psychiatric: He has a normal mood and affect. His behavior is normal.          Assessment & Plan:    Unremarkable annual examination Allergic rhinitis stable Episodic low back pain presently stable

## 2011-04-09 NOTE — Patient Instructions (Signed)
Limit your sodium (Salt) intake  You need to lose weight.  Consider a lower calorie diet and regular exercise.  Return in one year for follow-up   

## 2012-05-04 ENCOUNTER — Other Ambulatory Visit: Payer: Self-pay | Admitting: Internal Medicine

## 2012-07-04 ENCOUNTER — Other Ambulatory Visit: Payer: Self-pay | Admitting: Internal Medicine

## 2012-10-30 ENCOUNTER — Encounter: Payer: Self-pay | Admitting: Family Medicine

## 2012-10-30 ENCOUNTER — Ambulatory Visit (INDEPENDENT_AMBULATORY_CARE_PROVIDER_SITE_OTHER): Payer: BC Managed Care – PPO | Admitting: Family Medicine

## 2012-10-30 VITALS — BP 130/90 | HR 88 | Temp 99.1°F | Wt 206.0 lb

## 2012-10-30 DIAGNOSIS — J069 Acute upper respiratory infection, unspecified: Secondary | ICD-10-CM

## 2012-10-30 DIAGNOSIS — F410 Panic disorder [episodic paroxysmal anxiety] without agoraphobia: Secondary | ICD-10-CM

## 2012-10-30 MED ORDER — ALPRAZOLAM 0.25 MG PO TABS: 0.2500 mg | ORAL_TABLET | ORAL | Status: DC | PRN

## 2012-10-30 MED ORDER — PROPRANOLOL HCL 10 MG PO TABS: 10.0000 mg | ORAL_TABLET | ORAL | Status: DC | PRN

## 2012-10-30 MED ORDER — FLUTICASONE PROPIONATE 50 MCG/ACT NA SUSP: 2.0000 | Freq: Every day | NASAL | Status: DC

## 2012-10-30 NOTE — Patient Instructions (Addendum)
INSTRUCTIONS FOR UPPER RESPIRATORY INFECTION:  -plenty of rest and fluids  -nasal saline wash 2-3 times daily (use prepackaged nasal saline or bottled/distilled water if making your own)   -clean nose with nasal saline before using the nasal steroid or sinex  -can use sinex nasal spray for drainage and nasal congestion - but do NOT use longer then 3-4 days  -can use tylenol or ibuprofen as directed for aches and sorethroat  -in the winter time, using a humidifier at night is helpful (please follow cleaning instructions)  -if you are taking a cough medication - use only as directed, may also try a teaspoon of honey to coat the throat and throat lozenges  -for sore throat, salt water gargles can help  -follow up if you have fevers, are worsening or not getting better in 5-7 days  

## 2012-10-30 NOTE — Progress Notes (Signed)
Chief Complaint  Patient presents with  . Sinusitis    HPI:  -started: 4 days ago -symptoms:nasal congestion, sore throat, cough, chills, subjective fevers, sinus pain -denies:fever, SOB, NVD, tooth pain, ear pain -has tried: tylenol, zyrtec -sick contacts: none known -Hx of: AR  Panic disorder with public speaking: -gets SOB, extremely anxious with public speaking -has public speaking events coming up -prescribed propranolol and xanax to take before events -needs refill  ROS: See pertinent positives and negatives per HPI.  Past Medical History  Diagnosis Date  . ALLERGIC RHINITIS 06/23/2008  . BENIGN PROSTATIC HYPERTROPHY, HX OF 07/08/2008  . GANGLION CYST, WRIST, LEFT 01/04/2010  . LOW BACK PAIN 06/25/2010  . NEPHROLITHIASIS, HX OF 06/23/2008    No family history on file.  History   Social History  . Marital Status: Married    Spouse Name: N/A    Number of Children: N/A  . Years of Education: N/A   Social History Main Topics  . Smoking status: Never Smoker   . Smokeless tobacco: Never Used  . Alcohol Use: No  . Drug Use: No  . Sexually Active: None   Other Topics Concern  . None   Social History Narrative  . None    Current outpatient prescriptions:ALPRAZolam (XANAX) 0.25 MG tablet, Take 1 tablet (0.25 mg total) by mouth as needed., Disp: 60 tablet, Rfl: 0;  cetirizine (ZYRTEC) 10 MG tablet, Take 10 mg by mouth daily.  , Disp: , Rfl: ;  propranolol (INDERAL) 10 MG tablet, Take 1 tablet (10 mg total) by mouth as needed., Disp: 60 tablet, Rfl: 0;  [DISCONTINUED] propranolol (INDERAL) 10 MG tablet, TAKE ONE TABLET BY MOUTH AS NEEDED., Disp: 30 tablet, Rfl: 0 fluticasone (FLONASE) 50 MCG/ACT nasal spray, Place 2 sprays into the nose daily., Disp: 16 g, Rfl: 0  EXAM:  Filed Vitals:   10/30/12 0912  BP: 130/90  Pulse: 88  Temp: 99.1 F (37.3 C)    There is no height on file to calculate BMI.  GENERAL: vitals reviewed and listed above, alert, oriented,  appears well hydrated and in no acute distress  HEENT: atraumatic, conjunttiva clear, no obvious abnormalities on inspection of external nose and ears, normal appearance of ear canals and TMs, clear nasal congestion, mild post oropharyngeal erythema with PND, no tonsillar edema or exudate, no sinus TTP  NECK: no obvious masses on inspection  LUNGS: clear to auscultation bilaterally, no wheezes, rales or rhonchi, good air movement  CV: HRRR, no peripheral edema  MS: moves all extremities without noticeable abnormality  PSYCH: pleasant and cooperative, no obvious depression or anxiety  ASSESSMENT AND PLAN:  Discussed the following assessment and plan:  1. Viral upper respiratory infection    2. Panic disorder  ALPRAZolam (XANAX) 0.25 MG tablet, propranolol (INDERAL) 10 MG tablet   -discussed risk/benefits of medications for likely VURI and return precuations -refilled medications for panic disorder related to public speaking after review of chart and prescribed by PCP in the past- advised will need to see PCP for all future refill -Patient advised to return or notify a doctor immediately if symptoms worsen or persist or new concerns arise.  There are no Patient Instructions on file for this visit.   Kriste Basque R.

## 2012-12-20 ENCOUNTER — Other Ambulatory Visit: Payer: Self-pay | Admitting: Internal Medicine

## 2012-12-22 ENCOUNTER — Other Ambulatory Visit: Payer: Self-pay | Admitting: *Deleted

## 2012-12-22 DIAGNOSIS — F410 Panic disorder [episodic paroxysmal anxiety] without agoraphobia: Secondary | ICD-10-CM

## 2012-12-22 MED ORDER — ALPRAZOLAM 0.25 MG PO TABS: 0.2500 mg | ORAL_TABLET | ORAL | Status: DC | PRN

## 2012-12-25 ENCOUNTER — Telehealth: Payer: Self-pay | Admitting: Internal Medicine

## 2012-12-25 NOTE — Telephone Encounter (Signed)
Pt needs CPX either first thing in the AM or 3pm or after. Due to work schedules, it is impossible to get off. There are no CPX slots that work for this. Pls advise. Thank you!

## 2012-12-25 NOTE — Telephone Encounter (Signed)
Please accomodate- OK to create spot by combining two 15 minute slots

## 2012-12-25 NOTE — Telephone Encounter (Signed)
Pt aware/appt made/kjh

## 2013-01-27 ENCOUNTER — Other Ambulatory Visit (INDEPENDENT_AMBULATORY_CARE_PROVIDER_SITE_OTHER): Payer: BC Managed Care – PPO

## 2013-01-27 DIAGNOSIS — Z0000 Encounter for general adult medical examination without abnormal findings: Secondary | ICD-10-CM

## 2013-01-27 LAB — BASIC METABOLIC PANEL
BUN: 17 mg/dL (ref 6–23)
CO2: 28 mEq/L (ref 19–32)
Chloride: 103 mEq/L (ref 96–112)
Glucose, Bld: 95 mg/dL (ref 70–99)
Potassium: 4.1 mEq/L (ref 3.5–5.1)

## 2013-01-27 LAB — HEPATIC FUNCTION PANEL
Albumin: 4.1 g/dL (ref 3.5–5.2)
Alkaline Phosphatase: 81 U/L (ref 39–117)
Total Protein: 6.6 g/dL (ref 6.0–8.3)

## 2013-01-27 LAB — CBC WITH DIFFERENTIAL/PLATELET
Basophils Relative: 0.5 % (ref 0.0–3.0)
Eosinophils Relative: 1.9 % (ref 0.0–5.0)
Lymphocytes Relative: 26.7 % (ref 12.0–46.0)
Neutrophils Relative %: 63.1 % (ref 43.0–77.0)
RBC: 5.45 Mil/uL (ref 4.22–5.81)
WBC: 5.4 10*3/uL (ref 4.5–10.5)

## 2013-01-27 LAB — PSA: PSA: 1.96 ng/mL (ref 0.10–4.00)

## 2013-01-27 LAB — POCT URINALYSIS DIPSTICK
Leukocytes, UA: NEGATIVE
Protein, UA: NEGATIVE
Spec Grav, UA: 1.02
Urobilinogen, UA: 2

## 2013-01-27 LAB — LIPID PANEL
LDL Cholesterol: 110 mg/dL — ABNORMAL HIGH (ref 0–99)
VLDL: 34.2 mg/dL (ref 0.0–40.0)

## 2013-02-01 ENCOUNTER — Encounter: Payer: BC Managed Care – PPO | Admitting: Internal Medicine

## 2013-02-12 ENCOUNTER — Ambulatory Visit (INDEPENDENT_AMBULATORY_CARE_PROVIDER_SITE_OTHER): Payer: BC Managed Care – PPO | Admitting: Internal Medicine

## 2013-02-12 ENCOUNTER — Encounter: Payer: Self-pay | Admitting: Internal Medicine

## 2013-02-12 VITALS — BP 122/80 | HR 68 | Temp 97.7°F | Resp 16 | Ht 70.0 in | Wt 203.0 lb

## 2013-02-12 DIAGNOSIS — Z87898 Personal history of other specified conditions: Secondary | ICD-10-CM

## 2013-02-12 DIAGNOSIS — Z0000 Encounter for general adult medical examination without abnormal findings: Secondary | ICD-10-CM

## 2013-02-12 DIAGNOSIS — M545 Low back pain, unspecified: Secondary | ICD-10-CM

## 2013-02-12 DIAGNOSIS — J309 Allergic rhinitis, unspecified: Secondary | ICD-10-CM

## 2013-02-12 DIAGNOSIS — Z87442 Personal history of urinary calculi: Secondary | ICD-10-CM

## 2013-02-12 DIAGNOSIS — F410 Panic disorder [episodic paroxysmal anxiety] without agoraphobia: Secondary | ICD-10-CM

## 2013-02-12 MED ORDER — FLUTICASONE PROPIONATE 50 MCG/ACT NA SUSP: 2.0000 | Freq: Every day | NASAL | Status: DC | PRN

## 2013-02-12 MED ORDER — PROPRANOLOL HCL 10 MG PO TABS: 10.0000 mg | ORAL_TABLET | ORAL | Status: DC | PRN

## 2013-02-12 MED ORDER — ALPRAZOLAM 0.25 MG PO TABS: 0.2500 mg | ORAL_TABLET | ORAL | Status: DC | PRN

## 2013-02-12 NOTE — Patient Instructions (Signed)
It is important that you exercise regularly, at least 20 minutes 3 to 4 times per week.  If you develop chest pain or shortness of breath seek  medical attention.  Return in one year for follow-up   

## 2013-02-12 NOTE — Progress Notes (Signed)
Subjective:    Patient ID: Todd Huff, male    DOB: 19-Jun-1961, 52 y.o.   MRN: [id number]  HPI  52 year old patient who is seen today for a wellness exam  Allergies:  No Known Drug Allergies   Past History:  Past Medical History:  Reviewed history from 06/23/2008 and no changes required.  performance anxiety  vitiligo  left shoulder pain  family history of colonic polyps  Nephrolithiasis, hx of  Allergic rhinitis   Past Surgical History:  Reviewed history from 06/23/2008 and no changes required.  lithotripsy October 2007  spermatocele resection approximately 10 years ago   Family History:  Reviewed history from 06/23/2008 and no changes required.  father is 61, history of hypercholesterolemia, colonic polyps  mother, age 45-history of hypertension  one sister is well   Social History:  Reviewed history from 06/23/2008 and no changes required.  Married  Never Smoked  two sons, age 69 and 100  two stepdaughters aged 71 and 57  college graduate- NA Product manager   Past Medical History  Diagnosis Date  . ALLERGIC RHINITIS 06/23/2008  . BENIGN PROSTATIC HYPERTROPHY, HX OF 07/08/2008  . GANGLION CYST, WRIST, LEFT 01/04/2010  . LOW BACK PAIN 06/25/2010  . NEPHROLITHIASIS, HX OF 06/23/2008    History   Social History  . Marital Status: Married    Spouse Name: N/A    Number of Children: N/A  . Years of Education: N/A   Occupational History  . Not on file.   Social History Main Topics  . Smoking status: Never Smoker   . Smokeless tobacco: Never Used  . Alcohol Use: No  . Drug Use: No  . Sexually Active: Not on file   Other Topics Concern  . Not on file   Social History Narrative  . No narrative on file    Past Surgical History  Procedure Laterality Date  . Spermatocelectomy      No family history on file.  Allergies  Allergen Reactions  . Hydrocodone Nausea Only    Current Outpatient Prescriptions on File Prior to Visit  Medication Sig Dispense  Refill  . ALPRAZolam (XANAX) 0.25 MG tablet Take 1 tablet (0.25 mg total) by mouth as needed.  60 tablet  1  . cetirizine (ZYRTEC) 10 MG tablet Take 10 mg by mouth daily.        . propranolol (INDERAL) 10 MG tablet Take 1 tablet (10 mg total) by mouth as needed.  60 tablet  0   No current facility-administered medications on file prior to visit.    BP 122/80  Pulse 68  Temp(Src) 97.7 F (36.5 C) (Oral)  Resp 16  Ht 5\' 10"  (1.778 m)  Wt 203 lb (92.08 kg)  BMI 29.13 kg/m2  SpO2 98%      Review of Systems  Constitutional: Negative for fever, chills, activity change, appetite change and fatigue.  HENT: Negative for hearing loss, ear pain, congestion, rhinorrhea, sneezing, mouth sores, trouble swallowing, neck pain, neck stiffness, dental problem, voice change, sinus pressure and tinnitus.   Eyes: Negative for photophobia, pain, redness and visual disturbance.  Respiratory: Negative for apnea, cough, choking, chest tightness, shortness of breath and wheezing.   Cardiovascular: Negative for chest pain, palpitations and leg swelling.  Gastrointestinal: Negative for nausea, vomiting, abdominal pain, diarrhea, constipation, blood in stool, abdominal distention, anal bleeding and rectal pain.  Genitourinary: Negative for dysuria, urgency, frequency, hematuria, flank pain, decreased urine volume, discharge, penile swelling, scrotal swelling, difficulty urinating, genital sores and  testicular pain.  Musculoskeletal: Positive for back pain. Negative for myalgias, joint swelling, arthralgias and gait problem.  Skin: Negative for color change, rash and wound.  Neurological: Positive for dizziness. Negative for tremors, seizures, syncope, facial asymmetry, speech difficulty, weakness, light-headedness, numbness and headaches.  Hematological: Negative for adenopathy. Does not bruise/bleed easily.  Psychiatric/Behavioral: Negative for suicidal ideas, hallucinations, behavioral problems, confusion,  sleep disturbance, self-injury, dysphoric mood, decreased concentration and agitation. The patient is not nervous/anxious.        Objective:   Physical Exam  Constitutional: He appears well-developed and well-nourished.  HENT:  Head: Normocephalic and atraumatic.  Right Ear: External ear normal.  Left Ear: External ear normal.  Nose: Nose normal.  Mouth/Throat: Oropharynx is clear and moist.  Eyes: Conjunctivae and EOM are normal. Pupils are equal, round, and reactive to light. No scleral icterus.  Neck: Normal range of motion. Neck supple. No JVD present. No thyromegaly present.  Cardiovascular: Regular rhythm, normal heart sounds and intact distal pulses.  Exam reveals no gallop and no friction rub.   No murmur heard. Pulmonary/Chest: Effort normal and breath sounds normal. He exhibits no tenderness.  Abdominal: Soft. Bowel sounds are normal. He exhibits no distension and no mass. There is no tenderness.  Genitourinary: Prostate normal and penis normal.  Prostate +2 and symmetrical  Musculoskeletal: Normal range of motion. He exhibits no edema and no tenderness.  Lymphadenopathy:    He has no cervical adenopathy.  Neurological: He is alert. He has normal reflexes. No cranial nerve deficit. Coordination normal.  Skin: Skin is warm and dry. No rash noted.  Psychiatric: He has a normal mood and affect. His behavior is normal.          Assessment & Plan:   Preventive health examination Low back pain stable History of nephrolithiasis Mild BPH  Medicines updated Recheck 1 year or as needed

## 2013-08-09 ENCOUNTER — Ambulatory Visit (INDEPENDENT_AMBULATORY_CARE_PROVIDER_SITE_OTHER): Payer: BC Managed Care – PPO | Admitting: Internal Medicine

## 2013-08-09 ENCOUNTER — Encounter: Payer: Self-pay | Admitting: Internal Medicine

## 2013-08-09 VITALS — BP 152/90 | HR 64 | Temp 97.9°F | Resp 20 | Wt 212.0 lb

## 2013-08-09 DIAGNOSIS — H9313 Tinnitus, bilateral: Secondary | ICD-10-CM

## 2013-08-09 DIAGNOSIS — H9319 Tinnitus, unspecified ear: Secondary | ICD-10-CM

## 2013-08-09 MED ORDER — SUMATRIPTAN SUCCINATE 50 MG PO TABS: ORAL_TABLET | ORAL | Status: DC

## 2013-08-09 NOTE — Patient Instructions (Addendum)
Limit your sodium (Salt) intake  Please check your blood pressure on a regular basis.  If it is consistently greater than 150/90, please make an office appointment.  ENT evaluation as discussed

## 2013-08-09 NOTE — Progress Notes (Signed)
Subjective:    Patient ID: Todd Huff, male    DOB: 08/24/1961, 52 y.o.   MRN: [id number]  HPI   52 year old patient who is seen today in followup. His chief complaint is worsening tinnitus that he has had for at least  2 or 3 years. He has a history of diminished auditory acuity affecting the left ear. This apparently has been documented via audiometry at work. In addition he complains of worsening vertigo with positional change especially neck extension.  He also complains of the episodes of visual disturbances that he describes as difficulty focusing that may last 1-1/2 hours. These are followed by headaches. He does have a history of childhood migraines associated with similar visual disturbances and much more severe debilitating headaches.  Past Medical History  Diagnosis Date  . ALLERGIC RHINITIS 06/23/2008  . BENIGN PROSTATIC HYPERTROPHY, HX OF 07/08/2008  . GANGLION CYST, WRIST, LEFT 01/04/2010  . LOW BACK PAIN 06/25/2010  . NEPHROLITHIASIS, HX OF 06/23/2008    History   Social History  . Marital Status: Married    Spouse Name: N/A    Number of Children: N/A  . Years of Education: N/A   Occupational History  . Not on file.   Social History Main Topics  . Smoking status: Never Smoker   . Smokeless tobacco: Never Used  . Alcohol Use: No  . Drug Use: No  . Sexual Activity: Not on file   Other Topics Concern  . Not on file   Social History Narrative  . No narrative on file    Past Surgical History  Procedure Laterality Date  . Spermatocelectomy      No family history on file.  Allergies  Allergen Reactions  . Hydrocodone Nausea Only    Current Outpatient Prescriptions on File Prior to Visit  Medication Sig Dispense Refill  . ALPRAZolam (XANAX) 0.25 MG tablet Take 1 tablet (0.25 mg total) by mouth as needed.  60 tablet  1  . cetirizine (ZYRTEC) 10 MG tablet Take 10 mg by mouth daily.        . propranolol (INDERAL) 10 MG tablet Take 1 tablet (10 mg total) by  mouth as needed.  60 tablet  0  . fluticasone (FLONASE) 50 MCG/ACT nasal spray Place 2 sprays into the nose daily as needed.  16 g  2   No current facility-administered medications on file prior to visit.    BP 152/90  Pulse 64  Temp(Src) 97.9 F (36.6 C) (Oral)  Resp 20  Wt 212 lb (96.163 kg)  BMI 30.42 kg/m2  SpO2 98%       Review of Systems  Constitutional: Negative for fever, chills, appetite change and fatigue.  HENT: Positive for hearing loss. Negative for ear pain, congestion, sore throat, trouble swallowing, neck stiffness, dental problem, voice change and tinnitus.   Eyes: Positive for visual disturbance. Negative for pain and discharge.  Respiratory: Negative for cough, chest tightness, wheezing and stridor.   Cardiovascular: Negative for chest pain, palpitations and leg swelling.  Gastrointestinal: Negative for nausea, vomiting, abdominal pain, diarrhea, constipation, blood in stool and abdominal distention.  Genitourinary: Negative for urgency, hematuria, flank pain, discharge, difficulty urinating and genital sores.  Musculoskeletal: Negative for myalgias, back pain, joint swelling, arthralgias and gait problem.  Skin: Negative for rash.  Neurological: Positive for light-headedness and headaches. Negative for dizziness, syncope, speech difficulty, weakness and numbness.  Hematological: Negative for adenopathy. Does not bruise/bleed easily.  Psychiatric/Behavioral: Negative for behavioral problems and dysphoric mood.  The patient is not nervous/anxious.        Objective:   Physical Exam  Constitutional: He is oriented to person, place, and time. He appears well-developed.  Repeat blood pressure 140/84  HENT:  Head: Normocephalic.  Right Ear: External ear normal.  Left Ear: External ear normal.  Eyes: Conjunctivae and EOM are normal.  Neck: Normal range of motion.  Cardiovascular: Normal rate and normal heart sounds.   Pulmonary/Chest: Breath sounds normal.   Abdominal: Bowel sounds are normal.  Musculoskeletal: Normal range of motion. He exhibits no edema and no tenderness.  Neurological: He is alert and oriented to person, place, and time.  Weber does not lateralize Decrease hearing slightly on the left  Canals and tympanic membranes unremarkable  Psychiatric: He has a normal mood and affect. His behavior is normal.          Assessment & Plan:   Rule out Mnire's disorder. Will set up for ENT evaluation in view of his history of mild hearing loss on the left progressive tinnitus and positional vertigo  Probable migraine syndrome. We'll give a trial of Imitrex

## 2013-08-10 ENCOUNTER — Telehealth: Payer: Self-pay | Admitting: *Deleted

## 2013-08-10 DIAGNOSIS — H8110 Benign paroxysmal vertigo, unspecified ear: Secondary | ICD-10-CM

## 2013-08-10 DIAGNOSIS — H9192 Unspecified hearing loss, left ear: Secondary | ICD-10-CM

## 2013-08-10 DIAGNOSIS — H9313 Tinnitus, bilateral: Secondary | ICD-10-CM

## 2013-08-10 NOTE — Telephone Encounter (Signed)
Left message on voicemail to call office. Rx for Imitrex sent to pharmacy.

## 2013-08-12 NOTE — Telephone Encounter (Signed)
Left message on voicemail to call office, to make sure pt picked up Rx.

## 2013-08-12 NOTE — Telephone Encounter (Signed)
Spoke to pt told him Rx for Imitrex for headaches was sent to pharmacy for him. Pt verbalized understanding and asked if referrral was done for ENT. Look in chart saw was noted in note but referral was not done. Told pt will send order for referral to ENT and someone will contact him. Pt verbalized understanding. Order done for ENT referral.

## 2014-03-23 ENCOUNTER — Other Ambulatory Visit: Payer: Self-pay | Admitting: Internal Medicine

## 2014-05-19 ENCOUNTER — Ambulatory Visit (INDEPENDENT_AMBULATORY_CARE_PROVIDER_SITE_OTHER): Payer: BC Managed Care – PPO | Admitting: Physician Assistant

## 2014-05-19 ENCOUNTER — Encounter: Payer: Self-pay | Admitting: Physician Assistant

## 2014-05-19 VITALS — BP 100/88 | HR 70 | Temp 97.9°F | Resp 18 | Wt 210.0 lb

## 2014-05-19 DIAGNOSIS — L0291 Cutaneous abscess, unspecified: Secondary | ICD-10-CM

## 2014-05-19 DIAGNOSIS — L0390 Cellulitis, unspecified: Secondary | ICD-10-CM

## 2014-05-19 MED ORDER — AMOXICILLIN-POT CLAVULANATE 875-125 MG PO TABS: 1.0000 | ORAL_TABLET | Freq: Two times a day (BID) | ORAL | Status: DC

## 2014-05-19 NOTE — Progress Notes (Signed)
Pre visit review using our clinic review tool, if applicable. No additional management support is needed unless otherwise documented below in the visit note. 

## 2014-05-19 NOTE — Progress Notes (Signed)
Subjective:    Patient ID: Todd Huff, male    DOB: 1961-05-09, 52 y.o.   MRN: [id number]  Otalgia  There is pain in the right ear. This is a new problem. The current episode started in the past 7 days. The problem occurs constantly. The problem has been unchanged. The fever has been present for less than 1 day. The pain is at a severity of 2/10. The pain is mild. Associated symptoms include headaches and hearing loss (maybe a little). Pertinent negatives include no abdominal pain, coughing, diarrhea, drainage, ear discharge, neck pain (Does state jaw discomfort. ), rash, rhinorrhea, sore throat or vomiting. He has tried nothing for the symptoms. His past medical history is significant for hearing loss (sees audiology.). There is no history of a chronic ear infection or a tympanostomy tube.      Review of Systems  Constitutional: Positive for fever and chills. Negative for diaphoresis.  HENT: Positive for ear pain, hearing loss (maybe a little) and tinnitus. Negative for ear discharge, postnasal drip, rhinorrhea, sinus pressure, sore throat and trouble swallowing.   Respiratory: Negative for cough and shortness of breath.   Gastrointestinal: Negative for nausea, vomiting, abdominal pain and diarrhea.  Musculoskeletal: Negative for neck pain (Does state jaw discomfort. ).  Skin: Negative for rash.  Neurological: Positive for headaches. Negative for dizziness.  All other systems reviewed and are negative.    Past Medical History  Diagnosis Date  . ALLERGIC RHINITIS 06/23/2008  . BENIGN PROSTATIC HYPERTROPHY, HX OF 07/08/2008  . GANGLION CYST, WRIST, LEFT 01/04/2010  . LOW BACK PAIN 06/25/2010  . NEPHROLITHIASIS, HX OF 06/23/2008   Past Surgical History  Procedure Laterality Date  . Spermatocelectomy      reports that he has never smoked. He has never used smokeless tobacco. He reports that he does not drink alcohol or use illicit drugs. family history is not on file. Allergies    Allergen Reactions  . Hydrocodone Nausea Only       Objective:   Physical Exam  Nursing note and vitals reviewed. Constitutional: He is oriented to person, place, and time. He appears well-developed and well-nourished. No distress.  HENT:  Head: Normocephalic and atraumatic.  Left Ear: External ear normal.  Nose: Nose normal.  Mouth/Throat: Oropharynx is clear and moist. No oropharyngeal exudate.  Right external ear erythema. The auricle is inflamed. There is no TTP.  The Bilat TMs are normal. The bilat Frontal and Max sinuses are non-TTP.   Eyes: Conjunctivae and EOM are normal. Pupils are equal, round, and reactive to light.  Neck: Normal range of motion. Neck supple. No JVD present.  Cardiovascular: Normal rate, regular rhythm, normal heart sounds and intact distal pulses.  Exam reveals no gallop and no friction rub.   No murmur heard. Pulmonary/Chest: Effort normal and breath sounds normal. No stridor. No respiratory distress. He has no wheezes. He has no rales. He exhibits no tenderness.  Musculoskeletal: Normal range of motion.  No TMJ TTP.  Lymphadenopathy:    He has no cervical adenopathy.  Neurological: He is alert and oriented to person, place, and time.  Skin: Skin is warm and dry. No rash noted. He is not diaphoretic. No erythema. No pallor.  The skin of the Right Auricle is erythematous. There is excessive warmth to touch. No TTP. No Fluctuance. No Foreign Body.   Psychiatric: He has a normal mood and affect. His behavior is normal. Judgment and thought content normal.   Filed Vitals:  05/19/14 1430  BP: 100/88  Pulse: 70  Temp: 97.9 F (36.6 C)  Resp: 18    Lab Results  Component Value Date   WBC 5.4 01/27/2013   HGB 16.1 01/27/2013   HCT 47.5 01/27/2013   PLT 168.0 01/27/2013   GLUCOSE 95 01/27/2013   CHOL 176 01/27/2013   TRIG 171.0* 01/27/2013   HDL 31.60* 01/27/2013   LDLCALC 110* 01/27/2013   ALT 23 01/27/2013   AST 19 01/27/2013   NA 138 01/27/2013   K  4.1 01/27/2013   CL 103 01/27/2013   CREATININE 1.1 01/27/2013   BUN 17 01/27/2013   CO2 28 01/27/2013   TSH 1.61 01/27/2013   PSA 1.96 01/27/2013         Assessment & Plan:  Todd Huff was seen today for otalgia.  Diagnoses and associated orders for this visit:  Cellulitis Comments: Right ear. No sign of OM. No fluctuance. Will treat with augmentin.  - amoxicillin-clavulanate (AUGMENTIN) 875-125 MG per tablet; Take 1 tablet by mouth 2 (two) times daily.    Plan to follow up as needed, or for worsening or persistent symptoms despite treatment.  Patient Instructions  Augmentin twice a day for 10 days for cellulitis. Take with food to avoid nausea.  Maintain fluid hydration.  Followup as needed, or sooner if symptoms worsen or fail to improve despite treatment.

## 2014-05-19 NOTE — Patient Instructions (Signed)
Augmentin twice a day for 10 days for cellulitis. Take with food to avoid nausea.  Maintain fluid hydration.  Followup as needed, or sooner if symptoms worsen or fail to improve despite treatment.   Cellulitis Cellulitis is an infection of the skin and the tissue under the skin. The infected area is usually red and tender. This happens most often in the arms and lower legs. HOME CARE   Take your antibiotic medicine as told. Finish the medicine even if you start to feel better.  Keep the infected arm or leg raised (elevated).  Put a warm cloth on the area up to 4 times per day.  Only take medicines as told by your doctor.  Keep all doctor visits as told. GET HELP RIGHT AWAY IF:   You have a fever.  You feel very sleepy.  You throw up (vomit) or have watery poop (diarrhea).  You feel sick and have muscle aches and pains.  You see red streaks on the skin coming from the infected area.  Your red area gets bigger or turns a dark color.  Your bone or joint under the infected area is painful after the skin heals.  Your infection comes back in the same area or different area.  You have a puffy (swollen) bump in the infected area.  You have new symptoms. MAKE SURE YOU:   Understand these instructions.  Will watch your condition.  Will get help right away if you are not doing well or get worse. Document Released: 05/20/2008 Document Revised: 06/02/2012 Document Reviewed: 02/17/2012 [facility] Patient Information 2014 [place], [place].

## 2015-01-09 ENCOUNTER — Other Ambulatory Visit: Payer: BC Managed Care – PPO

## 2015-01-16 ENCOUNTER — Encounter: Payer: BC Managed Care – PPO | Admitting: Internal Medicine

## 2015-02-16 ENCOUNTER — Other Ambulatory Visit: Payer: Self-pay | Admitting: Internal Medicine

## 2015-02-17 ENCOUNTER — Other Ambulatory Visit (INDEPENDENT_AMBULATORY_CARE_PROVIDER_SITE_OTHER): Payer: BLUE CROSS/BLUE SHIELD

## 2015-02-17 DIAGNOSIS — Z0000 Encounter for general adult medical examination without abnormal findings: Secondary | ICD-10-CM

## 2015-02-17 LAB — CBC WITH DIFFERENTIAL/PLATELET
BASOS ABS: 0 10*3/uL (ref 0.0–0.1)
Basophils Relative: 0.4 % (ref 0.0–3.0)
EOS ABS: 0.1 10*3/uL (ref 0.0–0.7)
Eosinophils Relative: 1.4 % (ref 0.0–5.0)
HCT: 47 % (ref 39.0–52.0)
HEMOGLOBIN: 16 g/dL (ref 13.0–17.0)
LYMPHS PCT: 27.8 % (ref 12.0–46.0)
Lymphs Abs: 1.5 10*3/uL (ref 0.7–4.0)
MCHC: 34.2 g/dL (ref 30.0–36.0)
MCV: 86.3 fl (ref 78.0–100.0)
MONOS PCT: 8 % (ref 3.0–12.0)
Monocytes Absolute: 0.4 10*3/uL (ref 0.1–1.0)
NEUTROS ABS: 3.5 10*3/uL (ref 1.4–7.7)
NEUTROS PCT: 62.4 % (ref 43.0–77.0)
Platelets: 172 10*3/uL (ref 150.0–400.0)
RBC: 5.44 Mil/uL (ref 4.22–5.81)
RDW: 13.8 % (ref 11.5–15.5)
WBC: 5.5 10*3/uL (ref 4.0–10.5)

## 2015-02-17 LAB — POCT URINALYSIS DIPSTICK
BILIRUBIN UA: NEGATIVE
Blood, UA: NEGATIVE
Glucose, UA: NEGATIVE
KETONES UA: NEGATIVE
LEUKOCYTES UA: NEGATIVE
Nitrite, UA: NEGATIVE
Protein, UA: NEGATIVE
SPEC GRAV UA: 1.015
Urobilinogen, UA: 0.2
pH, UA: 7

## 2015-02-17 LAB — BASIC METABOLIC PANEL
BUN: 18 mg/dL (ref 6–23)
CALCIUM: 8.9 mg/dL (ref 8.4–10.5)
CO2: 31 meq/L (ref 19–32)
CREATININE: 1.18 mg/dL (ref 0.40–1.50)
Chloride: 105 mEq/L (ref 96–112)
GFR: 68.06 mL/min (ref >60.00)
Glucose, Bld: 88 mg/dL (ref 70–99)
Potassium: 4 mEq/L (ref 3.5–5.1)
Sodium: 140 mEq/L (ref 135–145)

## 2015-02-17 LAB — LIPID PANEL
CHOLESTEROL: 170 mg/dL (ref 0–200)
HDL: 34.3 mg/dL — ABNORMAL LOW (ref >39.00)
LDL CALC: 106 mg/dL — AB (ref 0–99)
NonHDL: 135.7
Total CHOL/HDL Ratio: 5
Triglycerides: 150 mg/dL — ABNORMAL HIGH (ref 0.0–149.0)
VLDL: 30 mg/dL (ref 0.0–40.0)

## 2015-02-17 LAB — HEPATIC FUNCTION PANEL
ALBUMIN: 4.2 g/dL (ref 3.5–5.2)
ALK PHOS: 77 U/L (ref 39–117)
ALT: 18 U/L (ref 0–53)
AST: 15 U/L (ref 0–37)
BILIRUBIN DIRECT: 0.1 mg/dL (ref 0.0–0.3)
Total Bilirubin: 0.7 mg/dL (ref 0.2–1.2)
Total Protein: 6.3 g/dL (ref 6.0–8.3)

## 2015-02-17 LAB — TSH: TSH: 1.62 u[IU]/mL (ref 0.35–4.50)

## 2015-02-17 LAB — PSA: PSA: 1.42 ng/mL (ref 0.10–4.00)

## 2015-02-24 ENCOUNTER — Encounter: Payer: Self-pay | Admitting: *Deleted

## 2015-02-24 ENCOUNTER — Encounter: Payer: Self-pay | Admitting: Internal Medicine

## 2015-02-24 ENCOUNTER — Ambulatory Visit (INDEPENDENT_AMBULATORY_CARE_PROVIDER_SITE_OTHER): Payer: BLUE CROSS/BLUE SHIELD | Admitting: Internal Medicine

## 2015-02-24 VITALS — BP 136/90 | HR 74 | Temp 98.0°F | Resp 18 | Ht 70.25 in | Wt 211.0 lb

## 2015-02-24 DIAGNOSIS — Z860100 Personal history of colon polyps, unspecified: Secondary | ICD-10-CM | POA: Insufficient documentation

## 2015-02-24 DIAGNOSIS — Z0000 Encounter for general adult medical examination without abnormal findings: Secondary | ICD-10-CM

## 2015-02-24 MED ORDER — ALPRAZOLAM 0.25 MG PO TABS: 0.2500 mg | ORAL_TABLET | ORAL | Status: DC | PRN

## 2015-02-24 NOTE — Progress Notes (Signed)
Pre visit review using our clinic review tool, if applicable. No additional management support is needed unless otherwise documented below in the visit note. 

## 2015-02-24 NOTE — Progress Notes (Signed)
Subjective:    Patient ID: Todd Huff, male    DOB: March 27, 1961, 54 y.o.   MRN: [id number]  HPI  54 year old patient who is seen today for a wellness exam.  He does quite well.  No major concerns or complaints today He did have a colonoscopy 5 years ago that did reveal adenomatous polyps; ive-year follow-up recommended  Past Medical History  Diagnosis Date  . ALLERGIC RHINITIS 06/23/2008  . BENIGN PROSTATIC HYPERTROPHY, HX OF 07/08/2008  . GANGLION CYST, WRIST, LEFT 01/04/2010  . LOW BACK PAIN 06/25/2010  . NEPHROLITHIASIS, HX OF 06/23/2008    History   Social History  . Marital Status: Married    Spouse Name: N/A  . Number of Children: N/A  . Years of Education: N/A   Occupational History  . Not on file.   Social History Main Topics  . Smoking status: Never Smoker   . Smokeless tobacco: Never Used  . Alcohol Use: No  . Drug Use: No  . Sexual Activity: Not on file   Other Topics Concern  . Not on file   Social History Narrative    Past Surgical History  Procedure Laterality Date  . Spermatocelectomy      No family history on file.  Allergies  Allergen Reactions  . Hydrocodone Nausea Only    Current Outpatient Prescriptions on File Prior to Visit  Medication Sig Dispense Refill  . ALPRAZolam (XANAX) 0.25 MG tablet TAKE 1 TABLET BY MOUTH AS NEEDED 60 tablet 1  . cetirizine (ZYRTEC) 10 MG tablet Take 10 mg by mouth daily.      . propranolol (INDERAL) 10 MG tablet TAKE 1 TABLET BY MOUTH AS NEEDED 60 tablet 1  . SUMAtriptan (IMITREX) 50 MG tablet Do not exceed 2 dosages in a 24-hour period 10 tablet 0   No current facility-administered medications on file prior to visit.    BP 136/90 mmHg  Pulse 74  Temp(Src) 98 F (36.7 C) (Oral)  Resp 18  Ht 5' 10.25" (1.784 m)  Wt 211 lb (95.709 kg)  BMI 30.07 kg/m2  SpO2 97%   Family history of fairly unremarkable father 56 7 8  with history of colonic polyps and vertigo mother has had multiple skin cancers  resected.  One sister is well.  Maternal grandmother is 61 Review of Systems  Constitutional: Negative for fever, chills, activity change, appetite change and fatigue.  HENT: Negative for congestion, dental problem, ear pain, hearing loss, mouth sores, rhinorrhea, sinus pressure, sneezing, tinnitus, trouble swallowing and voice change.   Eyes: Negative for photophobia, pain, redness and visual disturbance.  Respiratory: Negative for apnea, cough, choking, chest tightness, shortness of breath and wheezing.   Cardiovascular: Negative for chest pain, palpitations and leg swelling.  Gastrointestinal: Negative for nausea, vomiting, abdominal pain, diarrhea, constipation, blood in stool, abdominal distention, anal bleeding and rectal pain.  Genitourinary: Negative for dysuria, urgency, frequency, hematuria, flank pain, decreased urine volume, discharge, penile swelling, scrotal swelling, difficulty urinating, genital sores and testicular pain.  Musculoskeletal: Positive for back pain. Negative for myalgias, joint swelling, arthralgias, gait problem, neck pain and neck stiffness.  Skin: Negative for color change, rash and wound.  Neurological: Negative for dizziness, tremors, seizures, syncope, facial asymmetry, speech difficulty, weakness, light-headedness, numbness and headaches.  Hematological: Negative for adenopathy. Does not bruise/bleed easily.  Psychiatric/Behavioral: Negative for suicidal ideas, hallucinations, behavioral problems, confusion, sleep disturbance, self-injury, dysphoric mood, decreased concentration and agitation. The patient is not nervous/anxious.  Objective:   Physical Exam  Constitutional: He appears well-developed and well-nourished.  HENT:  Head: Normocephalic and atraumatic.  Right Ear: External ear normal.  Left Ear: External ear normal.  Nose: Nose normal.  Mouth/Throat: Oropharynx is clear and moist.  Eyes: Conjunctivae and EOM are normal. Pupils are equal,  round, and reactive to light. No scleral icterus.  Neck: Normal range of motion. Neck supple. No JVD present. No thyromegaly present.  Cardiovascular: Regular rhythm, normal heart sounds and intact distal pulses.  Exam reveals no gallop and no friction rub.   No murmur heard. Pulmonary/Chest: Effort normal and breath sounds normal. He exhibits no tenderness.  Abdominal: Soft. Bowel sounds are normal. He exhibits no distension and no mass. There is no tenderness.  Genitourinary: Prostate normal and penis normal. Guaiac negative stool.  Mild prostate enlargement  Musculoskeletal: Normal range of motion. He exhibits no edema or tenderness.  Lymphadenopathy:    He has no cervical adenopathy.  Neurological: He is alert. He has normal reflexes. No cranial nerve deficit. Coordination normal.  Skin: Skin is warm and dry. No rash noted.  Scattered patchy areas of vitiligo  Psychiatric: He has a normal mood and affect. His behavior is normal.          Assessment & Plan:   Preventive health examination History colonic polyps.  Will schedule follow-up colonoscopy  Laboratory studies reviewed Recheck one year

## 2015-02-24 NOTE — Patient Instructions (Signed)
It is important that you exercise regularly, at least 20 minutes 3 to 4 times per week.  If you develop chest pain or shortness of breath seek  medical attention.  Health Maintenance A healthy lifestyle and preventative care can promote health and wellness.  Maintain regular health, dental, and eye exams.  Eat a healthy diet. Foods like vegetables, fruits, whole grains, low-fat dairy products, and lean protein foods contain the nutrients you need and are low in calories. Decrease your intake of foods high in solid fats, added sugars, and salt. Get information about a proper diet from your health care provider, if necessary.  Regular physical exercise is one of the most important things you can do for your health. Most adults should get at least 150 minutes of moderate-intensity exercise (any activity that increases your heart rate and causes you to sweat) each week. In addition, most adults need muscle-strengthening exercises on 2 or more days a week.   Maintain a healthy weight. The body mass index (BMI) is a screening tool to identify possible weight problems. It provides an estimate of body fat based on height and weight. Your health care provider can find your BMI and can help you achieve or maintain a healthy weight. For males 20 years and older:  A BMI below 18.5 is considered underweight.  A BMI of 18.5 to 24.9 is normal.  A BMI of 25 to 29.9 is considered overweight.  A BMI of 30 and above is considered obese.  Maintain normal blood lipids and cholesterol by exercising and minimizing your intake of saturated fat. Eat a balanced diet with plenty of fruits and vegetables. Blood tests for lipids and cholesterol should begin at age 20 and be repeated every 5 years. If your lipid or cholesterol levels are high, you are over age 50, or you are at high risk for heart disease, you may need your cholesterol levels checked more frequently.Ongoing high lipid and cholesterol levels should be  treated with medicines if diet and exercise are not working.  If you smoke, find out from your health care provider how to quit. If you do not use tobacco, do not start.  Lung cancer screening is recommended for adults aged 55-80 years who are at high risk for developing lung cancer because of a history of smoking. A yearly low-dose CT scan of the lungs is recommended for people who have at least a 30-pack-year history of smoking and are current smokers or have quit within the past 15 years. A pack year of smoking is smoking an average of 1 pack of cigarettes a day for 1 year (for example, a 30-pack-year history of smoking could mean smoking 1 pack a day for 30 years or 2 packs a day for 15 years). Yearly screening should continue until the smoker has stopped smoking for at least 15 years. Yearly screening should be stopped for people who develop a health problem that would prevent them from having lung cancer treatment.  If you choose to drink alcohol, do not have more than 2 drinks per day. One drink is considered to be 12 oz (360 mL) of beer, 5 oz (150 mL) of wine, or 1.5 oz (45 mL) of liquor.  Avoid the use of street drugs. Do not share needles with anyone. Ask for help if you need support or instructions about stopping the use of drugs.  High blood pressure causes heart disease and increases the risk of stroke. Blood pressure should be checked at least every   1-2 years. Ongoing high blood pressure should be treated with medicines if weight loss and exercise are not effective.  If you are 45-79 years old, ask your health care provider if you should take aspirin to prevent heart disease.  Diabetes screening involves taking a blood sample to check your fasting blood sugar level. This should be done once every 3 years after age 45 if you are at a normal weight and without risk factors for diabetes. Testing should be considered at a younger age or be carried out more frequently if you are overweight and  have at least 1 risk factor for diabetes.  Colorectal cancer can be detected and often prevented. Most routine colorectal cancer screening begins at the age of 50 and continues through age 75. However, your health care provider may recommend screening at an earlier age if you have risk factors for colon cancer. On a yearly basis, your health care provider may provide home test kits to check for hidden blood in the stool. A small camera at the end of a tube may be used to directly examine the colon (sigmoidoscopy or colonoscopy) to detect the earliest forms of colorectal cancer. Talk to your health care provider about this at age 50 when routine screening begins. A direct exam of the colon should be repeated every 5-10 years through age 75, unless early forms of precancerous polyps or small growths are found.  People who are at an increased risk for hepatitis B should be screened for this virus. You are considered at high risk for hepatitis B if:  You were born in a country where hepatitis B occurs often. Talk with your health care provider about which countries are considered high risk.  Your parents were born in a high-risk country and you have not received a shot to protect against hepatitis B (hepatitis B vaccine).  You have HIV or AIDS.  You use needles to inject street drugs.  You live with, or have sex with, someone who has hepatitis B.  You are a man who has sex with other men (MSM).  You get hemodialysis treatment.  You take certain medicines for conditions like cancer, organ transplantation, and autoimmune conditions.  Hepatitis C blood testing is recommended for all people born from 1945 through 1965 and any individual with known risk factors for hepatitis C.  Healthy men should no longer receive prostate-specific antigen (PSA) blood tests as part of routine cancer screening. Talk to your health care provider about prostate cancer screening.  Testicular cancer screening is not  recommended for adolescents or adult males who have no symptoms. Screening includes self-exam, a health care provider exam, and other screening tests. Consult with your health care provider about any symptoms you have or any concerns you have about testicular cancer.  Practice safe sex. Use condoms and avoid high-risk sexual practices to reduce the spread of sexually transmitted infections (STIs).  You should be screened for STIs, including gonorrhea and chlamydia if:  You are sexually active and are younger than 24 years.  You are older than 24 years, and your health care provider tells you that you are at risk for this type of infection.  Your sexual activity has changed since you were last screened, and you are at an increased risk for chlamydia or gonorrhea. Ask your health care provider if you are at risk.  If you are at risk of being infected with HIV, it is recommended that you take a prescription medicine daily to prevent HIV   infection. This is called pre-exposure prophylaxis (PrEP). You are considered at risk if:  You are a man who has sex with other men (MSM).  You are a heterosexual man who is sexually active with multiple partners.  You take drugs by injection.  You are sexually active with a partner who has HIV.  Talk with your health care provider about whether you are at high risk of being infected with HIV. If you choose to begin PrEP, you should first be tested for HIV. You should then be tested every 3 months for as long as you are taking PrEP.  Use sunscreen. Apply sunscreen liberally and repeatedly throughout the day. You should seek shade when your shadow is shorter than you. Protect yourself by wearing long sleeves, pants, a wide-brimmed hat, and sunglasses year round whenever you are outdoors.  Tell your health care provider of new moles or changes in moles, especially if there is a change in shape or color. Also, tell your health care provider if a mole is larger  than the size of a pencil eraser.  A one-time screening for abdominal aortic aneurysm (AAA) and surgical repair of large AAAs by ultrasound is recommended for men aged 65-75 years who are current or former smokers.  Stay current with your vaccines (immunizations). Document Released: 05/30/2008 Document Revised: 12/07/2013 Document Reviewed: 04/29/2011 ExitCare Patient Information 2015 [employer]. This information is not intended to replace advice given to you by your health care provider. Make sure you discuss any questions you have with your health care provider.  

## 2015-03-27 ENCOUNTER — Encounter: Payer: Self-pay | Admitting: Internal Medicine

## 2015-03-29 ENCOUNTER — Encounter: Payer: Self-pay | Admitting: Gastroenterology

## 2015-04-18 ENCOUNTER — Encounter: Payer: Self-pay | Admitting: Gastroenterology

## 2015-10-19 ENCOUNTER — Encounter: Payer: Self-pay | Admitting: Gastroenterology

## 2015-12-08 ENCOUNTER — Ambulatory Visit (AMBULATORY_SURGERY_CENTER): Payer: Self-pay | Admitting: *Deleted

## 2015-12-08 VITALS — Ht 70.0 in | Wt 201.8 lb

## 2015-12-08 DIAGNOSIS — Z86010 Personal history of colonic polyps: Secondary | ICD-10-CM

## 2015-12-08 MED ORDER — NA SULFATE-K SULFATE-MG SULF 17.5-3.13-1.6 GM/177ML PO SOLN: 1.0000 | Freq: Once | ORAL | Status: DC

## 2015-12-08 NOTE — Progress Notes (Signed)
Pt states some discomfort when he has a BM and he thinks its a hemorrhoid, 12-07-2015 he noted BRB in stool and on Toilet paper. Again he thinks hemorrhoid.  None seen today 12-08-2015 No egg or soy allergy known to patient  No issues with past sedation with any surgeries  or procedures, no intubation problems  No diet pills per pt. No home 02 use per patient   emmi declined

## 2015-12-29 ENCOUNTER — Encounter: Payer: Self-pay | Admitting: Gastroenterology

## 2015-12-29 ENCOUNTER — Ambulatory Visit (AMBULATORY_SURGERY_CENTER): Payer: BLUE CROSS/BLUE SHIELD | Admitting: Gastroenterology

## 2015-12-29 VITALS — BP 139/85 | HR 77 | Temp 98.0°F | Resp 20 | Ht 70.0 in | Wt 201.0 lb

## 2015-12-29 DIAGNOSIS — D122 Benign neoplasm of ascending colon: Secondary | ICD-10-CM

## 2015-12-29 DIAGNOSIS — D125 Benign neoplasm of sigmoid colon: Secondary | ICD-10-CM | POA: Diagnosis not present

## 2015-12-29 DIAGNOSIS — K635 Polyp of colon: Secondary | ICD-10-CM | POA: Diagnosis not present

## 2015-12-29 DIAGNOSIS — Z86010 Personal history of colonic polyps: Secondary | ICD-10-CM

## 2015-12-29 MED ORDER — SODIUM CHLORIDE 0.9 % IV SOLN: 500.0000 mL | INTRAVENOUS | Status: DC

## 2015-12-29 NOTE — Progress Notes (Signed)
No egg or soy allergy known to patient  No issues with past sedation with any surgeries  or procedures, no intubation problems  No diet pills No home 02 use per patient

## 2015-12-29 NOTE — Progress Notes (Signed)
Called to room to assist during endoscopic procedure.  Patient ID and intended procedure confirmed with present staff. Received instructions for my participation in the procedure from the performing physician.  

## 2015-12-29 NOTE — Progress Notes (Signed)
Report to PACU, RN, vss, BBS= Clear.  

## 2015-12-29 NOTE — Op Note (Signed)
[facility]  [address] [place], [id number]   COLONOSCOPY PROCEDURE REPORT  PATIENT: Todd Huff, Todd Huff  MR#: [id number] BIRTHDATE: Dec 08, 1961 , 25  yrs. old GENDER: male ENDOSCOPIST: Milus Banister, MD PROCEDURE DATE:  12/29/2015 PROCEDURE:   Colonoscopy, surveillance and Colonoscopy with snare polypectomy First Screening Colonoscopy - Avg.  risk and is 50 yrs.  old or older - No.  Prior Negative Screening - Now for repeat screening. N/A  History of Adenoma - Now for follow-up colonoscopy & has been > or = to 3 yrs.  Yes hx of adenoma.  Has been 3 or more years since last colonoscopy.  Polyps removed today? Yes ASA CLASS:   Class II INDICATIONS:Surveillance due to prior colonic neoplasia and "multiple small polyps throughout colon, removed with snare" by Dr. Verl Blalock, one was a TA. MEDICATIONS: Monitored anesthesia care and Propofol 200 mg IV  DESCRIPTION OF PROCEDURE:   After the risks benefits and alternatives of the procedure were thoroughly explained, informed consent was obtained.  The digital rectal exam revealed no abnormalities of the rectum.   The LB CF-H180AL Loaner [id number] endoscope was introduced through the anus and advanced to the cecum, which was identified by both the appendix and ileocecal valve. No adverse events experienced.   The quality of the prep was excellent.  The instrument was then slowly withdrawn as the colon was fully examined. Estimated blood loss is zero unless otherwise noted in this procedure report.   COLON FINDINGS: Two sessile polyps ranging between 3-42mm in size were found in the sigmoid colon and ascending colon.  Polypectomies were performed with a cold snare.  The resection was complete, the polyp tissue was completely retrieved and sent to histology.   The examination was otherwise normal.  Retroflexed views revealed no abnormalities. The time to cecum = 1.9 Withdrawal time = 8.9   The scope was withdrawn and the  procedure completed. COMPLICATIONS: There were no immediate complications.  ENDOSCOPIC IMPRESSION: 1.   Two sessile polyps ranging between 3-60mm in size were found in the sigmoid colon and ascending colon; polypectomies were performed with a cold snare 2.   The examination was otherwise normal  RECOMMENDATIONS: If the polyp(s) removed today are proven to be adenomatous (pre-cancerous) polyps, you will need a repeat colonoscopy in 5 years.  Otherwise you should continue to follow colorectal cancer screening guidelines for "routine risk" patients with colonoscopy in 10 years.  You will receive a letter within 1-2 weeks with the results of your biopsy as well as final recommendations.  Please call my office if you have not received a letter after 3 weeks.  eSigned:  Milus Banister, MD 12/29/2015 11:21 AM

## 2015-12-29 NOTE — Patient Instructions (Signed)
YOU HAD AN ENDOSCOPIC PROCEDURE TODAY AT THE [facility]:   Refer to the procedure report that was given to you for any specific questions about what was found during the examination.  If the procedure report does not answer your questions, please call your gastroenterologist to clarify.  If you requested that your care partner not be given the details of your procedure findings, then the procedure report has been included in a sealed envelope for you to review at your convenience later.  YOU SHOULD EXPECT: Some feelings of bloating in the abdomen. Passage of more gas than usual.  Walking can help get rid of the air that was put into your GI tract during the procedure and reduce the bloating. If you had a lower endoscopy (such as a colonoscopy or flexible sigmoidoscopy) you may notice spotting of blood in your stool or on the toilet paper. If you underwent a bowel prep for your procedure, you may not have a normal bowel movement for a few days.  Please Note:  You might notice some irritation and congestion in your nose or some drainage.  This is from the oxygen used during your procedure.  There is no need for concern and it should clear up in a day or so.  SYMPTOMS TO REPORT IMMEDIATELY:   Following lower endoscopy (colonoscopy or flexible sigmoidoscopy):  Excessive amounts of blood in the stool  Significant tenderness or worsening of abdominal pains  Swelling of the abdomen that is new, acute  Fever of 100F or higher  For urgent or emergent issues, a gastroenterologist can be reached at any hour by calling [phone].   DIET: Your first meal following the procedure should be a small meal and then it is ok to progress to your normal diet. Heavy or fried foods are harder to digest and may make you feel nauseous or bloated.  Likewise, meals heavy in dairy and vegetables can increase bloating.  Drink plenty of fluids but you should avoid alcoholic beverages for 24  hours.  ACTIVITY:  You should plan to take it easy for the rest of today and you should NOT DRIVE or use heavy machinery until tomorrow (because of the sedation medicines used during the test).    FOLLOW UP: Our staff will call the number listed on your records the next business day following your procedure to check on you and address any questions or concerns that you may have regarding the information given to you following your procedure. If we do not reach you, we will leave a message.  However, if you are feeling well and you are not experiencing any problems, there is no need to return our call.  We will assume that you have returned to your regular daily activities without incident.  If any biopsies were taken you will be contacted by phone or by letter within the next 1-3 weeks.  Please call us at [phone] if you have not heard about the biopsies in 3 weeks.    SIGNATURES/CONFIDENTIALITY: You and/or your care partner have signed paperwork which will be entered into your electronic medical record.  These signatures attest to the fact that that the information above on your After Visit Summary has been reviewed and is understood.  Full responsibility of the confidentiality of this discharge information lies with you and/or your care-partner.  Please review polyp handout provided. Next colonoscopy determined by pathology results; 5 or 10 years. 

## 2016-01-01 ENCOUNTER — Telehealth: Payer: Self-pay

## 2016-01-01 NOTE — Telephone Encounter (Signed)
  Follow up Call-  Call back number 12/29/2015  Post procedure Call Back phone  # [phone]  Permission to leave phone message Yes     Patient questions:  Do you have a fever, pain , or abdominal swelling? No. Pain Score  0 *  Have you tolerated food without any problems? Yes.    Have you been able to return to your normal activities? Yes.    Do you have any questions about your discharge instructions: Diet   No. Medications  No. Follow up visit  No.  Do you have questions or concerns about your Care? No.  Actions: * If pain score is 4 or above: No action needed, pain <4.

## 2016-01-03 ENCOUNTER — Encounter: Payer: Self-pay | Admitting: Gastroenterology

## 2016-02-29 ENCOUNTER — Other Ambulatory Visit: Payer: Self-pay | Admitting: Internal Medicine

## 2016-03-13 ENCOUNTER — Telehealth: Payer: Self-pay | Admitting: Internal Medicine

## 2016-03-13 NOTE — Telephone Encounter (Signed)
[facility] - Client [place]    --------------------------------------------------------------------------------   Patient Name: Todd Huff  Gender: Male  DOB: 06/14/1961   Age: 55 Y 3 M 19 D  Return Phone Number: [phone] (Primary), [phone] (Secondary)  [place]     [place]  [address]   Client [facility] Client  Client Site [facility] - Day  Physician Simonne Martinet   Contact Type Call  Who Is Calling Patient / Member / Family / Caregiver  Call Type Triage / Clinical  Relationship To Patient Self  Return Phone Number [phone] (Primary)  Chief Complaint Leg Injury  Reason for Call Symptomatic / Request for [place] states that he injured his left leg walking into his bed two weeks ago and now his heel has turned blue and the leg is still swollen.  Appointment Disposition EMR Patient Refused Appointment  Info pasted into Epic Yes  PreDisposition Call Doctor  Translation No       Nurse Assessment  Nurse: Amalia Hailey, RN, Melissa Date/Time (Eastern Time): 03/13/2016 5:32:08 PM  Confirm and document reason for call. If symptomatic, describe symptoms. You must click the next button to save text entered. ---Caller states that he injured his left leg walking into his bed two weeks ago and now his heel has turned blue and the leg is still swollen.    Has the patient traveled out of the country within the last 30 days? ---Not Applicable    Does the patient have any new or worsening symptoms? ---Yes    Will a triage be completed? ---Yes    Related visit to physician within the last 2 weeks? ---No    Does the PT have any chronic conditions? (i.e. diabetes, asthma, etc.) ---Yes    List chronic conditions. ---allergies    Is this a behavioral health or substance abuse call? ---No            Guidelines          Guideline Title Affirmed Question Affirmed Notes Nurse Date/Time (Eastern Time)  Leg Injury [1] Large swelling or bruise AND [2] size > palm of person's hand    Evans, RN, Melissa 03/13/2016 5:33:15 PM    Disp. Time [name] Time) Disposition Final User    03/13/2016 5:00:47 PM Attempt made - message left   Amalia Hailey, RN, Melissa    03/13/2016 5:14:36 PM Attempt made - no message left   Amalia Hailey, RN, Lenna Sciara      03/13/2016 5:35:46 PM See Physician within 24 Hours Yes Amalia Hailey, RN, Lenna Sciara            Caller Understands: Yes  Disagree/Comply: Comply       Care Advice Given Per Guideline        SEE PHYSICIAN WITHIN 24 HOURS: LOCAL COLD: For bruises or swelling, apply a cold pack or an ice bag (wrapped in a moist towel) to the area for 20 minutes per hour. Repeat for 4 consecutive hours. (Reason: reduce the bleeding and pain) PAIN MEDICINES: ACETAMINOPHEN (E.G., TYLENOL): IBUPROFEN (E.G., MOTRIN, ADVIL): CALL BACK IF: * You become worse.        --------------------------------------------------------------------------------         Comments  User: Colin Ina, RN Date/Time (Eastern Time): 03/13/2016 5:36:38 PM  Caller request he will make his own appt tomorrow morning.    Referrals  REFERRED TO PCP OFFICE

## 2016-03-14 ENCOUNTER — Telehealth: Payer: Self-pay | Admitting: Family Medicine

## 2016-03-14 NOTE — Telephone Encounter (Signed)
[employer] [facility] - Client [facility] Patient Name: STORM BRANDSMA Gender: Male DOB: [birth year] Age: 55 Y 3 M 19 D Return Phone Number: [phone] (Primary), [phone] (Secondary) Address: [place] [address] Client [facility] - Client Client Site [place] - Day Physician Simonne Martinet Contact Type Call Who Is Calling Patient / Member / Family / Caregiver Call Type Triage / Clinical Relationship To Patient Self Return Phone Number [phone] (Primary) Chief Complaint Leg Injury Reason for Call Symptomatic / Request for [place] states that he injured his left leg walking into his bed two weeks ago and now his heel has turned blue and the leg is still swollen. Appointment Disposition EMR Patient Refused Appointment Info pasted into Epic Yes PreDisposition Call Doctor Translation No Nurse Assessment Nurse: Amalia Hailey, RN, Melissa Date/Time (Eastern Time): 03/13/2016 5:32:08 PM Confirm and document reason for call. If symptomatic, describe symptoms. You must click the next button to save text entered. ---Caller states that he injured his left leg walking into his bed two weeks ago and now his heel has turned blue and the leg is still swollen. Has the patient traveled out of the country within the last 30 days? ---Not Applicable Does the patient have any new or worsening symptoms? ---Yes Will a triage be completed? ---Yes Related visit to physician within the last 2 weeks? ---No Does the PT have any chronic conditions? (i.e. diabetes, asthma, etc.) ---Yes List chronic conditions. ---allergies Is this a behavioral health or substance abuse call? ---No Guidelines Guideline Title Affirmed Question Affirmed Notes Nurse Date/Time (Eastern Time) Leg Injury [1] Large swelling or bruise AND [2] size > palm of  person's hand Evans, RN, Melissa 03/13/2016 5:33:15 PM Disp. Time [name] Time) Disposition Final User 03/13/2016 5:00:47 PM Attempt made - message left Amalia Hailey, RN, Melissa PLEASE NOTE: All timestamps contained within this report are represented as [place] Standard Time. CONFIDENTIALTY NOTICE: This fax transmission is intended only for the addressee. It contains information that is legally privileged, confidential or otherwise protected from use or disclosure. If you are not the intended recipient, you are strictly prohibited from reviewing, disclosing, copying using or disseminating any of this information or taking any action in reliance on or regarding this information. If you have received this fax in error, please notify us immediately by telephone so that we can arrange for its return to [place]. Phone: [phone], Toll-Free: [phone], Fax: [phone] Page: 2 of 2 Call Id: [id number] [place]. Time [name] Time) Disposition Final User 03/13/2016 5:14:36 PM Attempt made - no message left Amalia Hailey, RN, Lenna Sciara 03/13/2016 5:35:46 PM See Physician within 24 Hours Yes Amalia Hailey, RN, Lenna Sciara Caller Understands: Yes Disagree/Comply: Comply Care Advice Given Per Guideline SEE PHYSICIAN WITHIN 24 HOURS: LOCAL COLD: For bruises or swelling, apply a cold pack or an ice bag (wrapped in a moist towel) to the area for 20 minutes per hour. Repeat for 4 consecutive hours. (Reason: reduce the bleeding and pain) PAIN MEDICINES: ACETAMINOPHEN (E.G., TYLENOL): IBUPROFEN (E.G., MOTRIN, ADVIL): CALL BACK IF: * You become worse. Comments User: Colin Ina, RN Date/Time [name] Time): 03/13/2016 5:36:38 PM Caller request he will make his own appt tomorrow morning. Referrals REFERRED TO PCP OFFICE

## 2016-03-14 NOTE — Telephone Encounter (Signed)
Left a message on home and cell for a return call. 

## 2016-03-14 NOTE — Telephone Encounter (Signed)
Spoke to the pt.  Sx the same today.  Appt made to see Dr. Raliegh Ip on 03/15/16

## 2016-03-15 ENCOUNTER — Ambulatory Visit (INDEPENDENT_AMBULATORY_CARE_PROVIDER_SITE_OTHER): Payer: BLUE CROSS/BLUE SHIELD | Admitting: Internal Medicine

## 2016-03-15 ENCOUNTER — Telehealth: Payer: Self-pay | Admitting: Family Medicine

## 2016-03-15 ENCOUNTER — Encounter: Payer: Self-pay | Admitting: Internal Medicine

## 2016-03-15 ENCOUNTER — Telehealth: Payer: Self-pay | Admitting: Internal Medicine

## 2016-03-15 ENCOUNTER — Ambulatory Visit (HOSPITAL_COMMUNITY)
Admission: RE | Admit: 2016-03-15 | Discharge: 2016-03-15 | Disposition: A | Discharge status: Home / Self Care | Payer: BLUE CROSS/BLUE SHIELD | Source: Ambulatory Visit | Attending: Internal Medicine | Admitting: Internal Medicine

## 2016-03-15 VITALS — BP 160/100 | HR 72 | Temp 98.3°F | Resp 20 | Ht 70.0 in | Wt 209.0 lb

## 2016-03-15 DIAGNOSIS — M7989 Other specified soft tissue disorders: Secondary | ICD-10-CM

## 2016-03-15 DIAGNOSIS — M79605 Pain in left leg: Secondary | ICD-10-CM

## 2016-03-15 DIAGNOSIS — M79662 Pain in left lower leg: Secondary | ICD-10-CM

## 2016-03-15 NOTE — Progress Notes (Signed)
Subjective:    Patient ID: Todd Huff, male    DOB: Jan 14, 1961, 55 y.o.   MRN: [id number]  HPI  55 year old patient who traumatized his left anterior lower leg just distal to the knee about 2 weeks ago.  One week ago he noted pain and swelling.  His distal left lower leg. No history of DVT or no chest pain or shortness of breath.  BP Readings from Last 3 Encounters:  03/15/16 160/100  12/29/15 139/85  02/24/15 136/90    Past Medical History  Diagnosis Date  . ALLERGIC RHINITIS 06/23/2008  . BENIGN PROSTATIC HYPERTROPHY, HX OF 07/08/2008  . GANGLION CYST, WRIST, LEFT 01/04/2010  . LOW BACK PAIN 06/25/2010  . NEPHROLITHIASIS, HX OF 06/23/2008  . Allergy   . Chronic kidney disease     kidney  . Blood transfusion without reported diagnosis     3 units of his own blood   . Blood in stool     noted 12-22 in stool and toilet paper-? hemorrhoid- BRB    Social History   Social History  . Marital Status: Married    Spouse Name: N/A  . Number of Children: N/A  . Years of Education: N/A   Occupational History  . Not on file.   Social History Main Topics  . Smoking status: Never Smoker   . Smokeless tobacco: Never Used  . Alcohol Use: No  . Drug Use: No  . Sexual Activity: Not on file   Other Topics Concern  . Not on file   Social History Narrative    Past Surgical History  Procedure Laterality Date  . Spermatocelectomy    . Colonoscopy    . Polypectomy    . Ganglion cyst excision Left     wrist     Family History  Problem Relation Age of Onset  . Colon polyps Father   . Colon cancer Neg Hx   . Esophageal cancer Neg Hx   . Rectal cancer Neg Hx   . Stomach cancer Neg Hx   . Colitis Mother   . Diverticulosis Mother   . Colon polyps Mother     Allergies  Allergen Reactions  . Hydrocodone Nausea Only  . Codeine Nausea And Vomiting    Current Outpatient Prescriptions on File Prior to Visit  Medication Sig Dispense Refill  . ALPRAZolam (XANAX) 0.25 MG  tablet TAKE 1 TABLET BY MOUTH AS NEEDED AS DIRECTED 60 tablet 2  . cetirizine (ZYRTEC) 10 MG tablet Take 10 mg by mouth daily.      . propranolol (INDERAL) 10 MG tablet TAKE 1 TABLET BY MOUTH AS NEEDED 60 tablet 5   No current facility-administered medications on file prior to visit.    BP 160/100 mmHg  Pulse 72  Temp(Src) 98.3 F (36.8 C) (Oral)  Resp 20  Ht 5\' 10"  (1.778 m)  Wt 209 lb (94.802 kg)  BMI 29.99 kg/m2  SpO2 97%      Review of Systems  Constitutional: Negative for fever, chills, appetite change and fatigue.  HENT: Negative for congestion, dental problem, ear pain, hearing loss, sore throat, tinnitus, trouble swallowing and voice change.   Eyes: Negative for pain, discharge and visual disturbance.  Respiratory: Negative for cough, chest tightness, wheezing and stridor.   Cardiovascular: Positive for leg swelling. Negative for chest pain and palpitations.  Gastrointestinal: Negative for nausea, vomiting, abdominal pain, diarrhea, constipation, blood in stool and abdominal distention.  Genitourinary: Negative for urgency, hematuria, flank pain, discharge, difficulty urinating  and genital sores.  Musculoskeletal: Negative for myalgias, back pain, joint swelling, arthralgias, gait problem and neck stiffness.  Skin: Negative for rash.  Neurological: Negative for dizziness, syncope, speech difficulty, weakness, numbness and headaches.  Hematological: Negative for adenopathy. Does not bruise/bleed easily.  Psychiatric/Behavioral: Negative for behavioral problems and dysphoric mood. The patient is not nervous/anxious.        Objective:   Physical Exam  Constitutional: He appears well-developed.  Cardiovascular: Normal rate and normal heart sounds.   Pulmonary/Chest: Breath sounds normal.  Musculoskeletal: He exhibits no edema or tenderness.  Small hematoma anterior lower leg just distal to the patella Left calf swollen and tender to palpation Resolving ecchymoses  about the anterolateral ankle region  Ecchymoses noted involving the left medial foot  Psychiatric: He has a normal mood and affect. His behavior is normal.          Assessment & Plan:    Left leg pain and swelling.  Rule out DVT.  Hopeful to obtain venous Doppler study today. (Late Friday afternoon).  Patient was given samples of Xarelto 15 mg- if venous Doppler study is positive for DVT.  Will take twice a day.  Otherwise, will discard   Rule out sustained hypertension.  Home blood pressure monitoring.  Encouraged low-salt diet recommended

## 2016-03-15 NOTE — Patient Instructions (Signed)
Venous Doppler left leg as discussed  Limit your sodium (Salt) intake  Please check your blood pressure on a regular basis.  If it is consistently greater than 150/90, please make an office appointment.

## 2016-03-15 NOTE — Telephone Encounter (Signed)
Received call from team health regarding lower extremity Doppler. Lower extremity Doppler is reported to be preliminarily negative. Reviewed the preliminary report. Per review of the patient's PCP note if the Doppler was negative the patient would not take the Xarelto that he was provided in clinic. I advised the team health nurse to let the vascular lab know that they can let the patient know that his Doppler was negative and he did not need to take the Xarelto. She voiced understanding. Will forward to PCP as an FYI.

## 2016-03-15 NOTE — Progress Notes (Addendum)
*  PRELIMINARY RESULTS* Vascular Ultrasound Left lower extremity venous duplex has been completed.  Preliminary findings: No evidence of DVT or baker's cyst.   Called on call line at 5:35pm. Requested to speak with Dr. Burnice Logan about patient results. On call provider will call be back about this patient's results. Received call at [phone] from nurse, [name]. She took results and will call On call provider with negative results and then call me back within 30 minutes. Patient should wait here until call back. Received call back from Lynch at [phone] pm. She spoke with patient directly and informed him not to take Xarelto samples.     Landry Mellow, RDMS, RVT  03/15/2016, 5:38 PM

## 2016-03-18 NOTE — Telephone Encounter (Signed)
FYI

## 2016-03-18 NOTE — Telephone Encounter (Signed)
[facility] Night - Client [facility] Patient Name: Todd Huff Gender: Male DOB: 1961-10-22 Age: 55 Y 3 M 21 D Return Phone Number: Address: City/State/Zip: [place] [employer] [facility] Night - Client Client Site [facility] - Night Physician Simonne Martinet Contact Type Call Who Is Calling Lab Lab Name [name] Phone Number [phone] Lab Tech Name [name] Lab Reference Number none Chief Complaint Lab Result (Critical or Stat) Call Type Lab Send to RN Reason for Call Report lab results Initial Comment Caller is [name] [id number]- reporting a Venus ultrasound resuly- stated it was STAT Additional Comment Translation No Nurse Assessment Nurse: [name], RN, Felicia Date/Time (Eastern Time): 03/15/2016 5:45:26 PM Is there an on-call provider listed? ---Yes Please list name of person reporting value (Lab Employee) and a contact number. ---[name] - was told MD would still be at office to receive results. Called backline for her and it did not go thru - it had recording that disconnects after awhile. Told her can report to the on call that MD Please document the following items: Lab name Lab value (read back to lab to verify) Reference range for lab value Date and time blood was drawn Collect time of birth for bilirubin results ---lower ext venous duplex - negative for DVT (L leg) Please collect the patient contact information from the lab. (name, phone number and address) ---Phone no. [phone] [name], [name] [address] Guidelines Guideline Title Affirmed Question Affirmed Notes Nurse Date/Time [name] Time) Disp. Time [name] Time) Disposition Final User 03/15/2016 5:54:20 PM Paged On Call back to [facility], RN, [name] [phone] PM Clinical Call Yes [name], RN,  [name] PLEASE NOTE: All timestamps contained within this report are represented as [place] Standard Time. CONFIDENTIALTY NOTICE: This fax transmission is intended only for the addressee. It contains information that is legally privileged, confidential or otherwise protected from use or disclosure. If you are not the intended recipient, you are strictly prohibited from reviewing, disclosing, copying using or disseminating any of this information or taking any action in reliance on or regarding this information. If you have received this fax in error, please notify us immediately by telephone so that we can arrange for its return to [place]. Phone: [phone], Toll-Free: [phone], Fax: [phone] Page: 2 of 2 Call Id: [id number] Paging DoctorName Phone DateTime Result/Outcome Message Type Notes [name] [id number] 03/15/2016 5:54:20 PM Paged On Call Back to Call Center Doctor Paged [name] 03/15/2016 6:03:29 PM Spoke with On Call - General Message Result reported results - MD said pt can leave and let them know [place] is negative and it looks like he was given samples of xeralto and he should not take it since negative VORB - called lab and spoke directly to patient and told him [place] is negative for DVT and he should not take xeralto because no blood

## 2016-04-16 DIAGNOSIS — I781 Nevus, non-neoplastic: Secondary | ICD-10-CM | POA: Diagnosis not present

## 2016-04-16 DIAGNOSIS — L501 Idiopathic urticaria: Secondary | ICD-10-CM | POA: Diagnosis not present

## 2016-04-16 DIAGNOSIS — J310 Chronic rhinitis: Secondary | ICD-10-CM | POA: Diagnosis not present

## 2016-04-16 DIAGNOSIS — H1045 Other chronic allergic conjunctivitis: Secondary | ICD-10-CM | POA: Diagnosis not present

## 2016-05-08 ENCOUNTER — Other Ambulatory Visit: Payer: BLUE CROSS/BLUE SHIELD

## 2016-05-20 ENCOUNTER — Other Ambulatory Visit (INDEPENDENT_AMBULATORY_CARE_PROVIDER_SITE_OTHER): Payer: BLUE CROSS/BLUE SHIELD

## 2016-05-20 DIAGNOSIS — Z0000 Encounter for general adult medical examination without abnormal findings: Secondary | ICD-10-CM | POA: Diagnosis not present

## 2016-05-20 LAB — CBC WITH DIFFERENTIAL/PLATELET
BASOS ABS: 0 10*3/uL (ref 0.0–0.1)
Basophils Relative: 0.3 % (ref 0.0–3.0)
Eosinophils Absolute: 0.2 10*3/uL (ref 0.0–0.7)
Eosinophils Relative: 3.6 % (ref 0.0–5.0)
HEMATOCRIT: 44.5 % (ref 39.0–52.0)
Hemoglobin: 15.1 g/dL (ref 13.0–17.0)
LYMPHS ABS: 1.4 10*3/uL (ref 0.7–4.0)
LYMPHS PCT: 25.8 % (ref 12.0–46.0)
MCHC: 33.9 g/dL (ref 30.0–36.0)
MCV: 87.1 fl (ref 78.0–100.0)
MONOS PCT: 8.4 % (ref 3.0–12.0)
Monocytes Absolute: 0.5 10*3/uL (ref 0.1–1.0)
NEUTROS PCT: 61.9 % (ref 43.0–77.0)
Neutro Abs: 3.3 10*3/uL (ref 1.4–7.7)
Platelets: 200 10*3/uL (ref 150.0–400.0)
RBC: 5.11 Mil/uL (ref 4.22–5.81)
RDW: 13.1 % (ref 11.5–15.5)
WBC: 5.4 10*3/uL (ref 4.0–10.5)

## 2016-05-20 LAB — HEPATIC FUNCTION PANEL
ALBUMIN: 4.3 g/dL (ref 3.5–5.2)
ALK PHOS: 81 U/L (ref 39–117)
ALT: 24 U/L (ref 0–53)
AST: 28 U/L (ref 0–37)
BILIRUBIN DIRECT: 0.2 mg/dL (ref 0.0–0.3)
Total Bilirubin: 1 mg/dL (ref 0.2–1.2)
Total Protein: 6.2 g/dL (ref 6.0–8.3)

## 2016-05-20 LAB — BASIC METABOLIC PANEL
BUN: 17 mg/dL (ref 6–23)
CALCIUM: 8.8 mg/dL (ref 8.4–10.5)
CO2: 27 mEq/L (ref 19–32)
Chloride: 107 mEq/L (ref 96–112)
Creatinine, Ser: 1.24 mg/dL (ref 0.40–1.50)
GFR: 63.98 mL/min (ref >60.00)
Glucose, Bld: 100 mg/dL — ABNORMAL HIGH (ref 70–99)
Potassium: 3.5 mEq/L (ref 3.5–5.1)
SODIUM: 142 meq/L (ref 135–145)

## 2016-05-20 LAB — POC URINALSYSI DIPSTICK (AUTOMATED)
Glucose, UA: NEGATIVE
LEUKOCYTES UA: NEGATIVE
Nitrite, UA: NEGATIVE
PH UA: 5.5
Urobilinogen, UA: 1

## 2016-05-20 LAB — LIPID PANEL
Cholesterol: 170 mg/dL (ref 0–200)
HDL: 41.8 mg/dL (ref >39.00)
LDL Cholesterol: 117 mg/dL — ABNORMAL HIGH (ref 0–99)
NONHDL: 128.48
Total CHOL/HDL Ratio: 4
Triglycerides: 59 mg/dL (ref 0.0–149.0)
VLDL: 11.8 mg/dL (ref 0.0–40.0)

## 2016-05-20 LAB — PSA: PSA: 2.74 ng/mL (ref 0.10–4.00)

## 2016-05-20 LAB — TSH: TSH: 1.83 u[IU]/mL (ref 0.35–4.50)

## 2016-05-21 ENCOUNTER — Encounter: Payer: BLUE CROSS/BLUE SHIELD | Admitting: Internal Medicine

## 2016-05-27 ENCOUNTER — Encounter: Payer: Self-pay | Admitting: Internal Medicine

## 2016-05-27 ENCOUNTER — Ambulatory Visit (INDEPENDENT_AMBULATORY_CARE_PROVIDER_SITE_OTHER): Payer: BLUE CROSS/BLUE SHIELD | Admitting: Internal Medicine

## 2016-05-27 VITALS — BP 140/90 | HR 78 | Temp 98.3°F | Ht 70.0 in | Wt 202.0 lb

## 2016-05-27 DIAGNOSIS — M545 Low back pain: Secondary | ICD-10-CM | POA: Diagnosis not present

## 2016-05-27 DIAGNOSIS — G8929 Other chronic pain: Secondary | ICD-10-CM

## 2016-05-27 DIAGNOSIS — Z86010 Personal history of colonic polyps: Secondary | ICD-10-CM

## 2016-05-27 DIAGNOSIS — Z0000 Encounter for general adult medical examination without abnormal findings: Secondary | ICD-10-CM

## 2016-05-27 DIAGNOSIS — Z299 Encounter for prophylactic measures, unspecified: Secondary | ICD-10-CM

## 2016-05-27 DIAGNOSIS — Z87442 Personal history of urinary calculi: Secondary | ICD-10-CM

## 2016-05-27 NOTE — Patient Instructions (Signed)
Limit your sodium (Salt) intake    It is important that you exercise regularly, at least 20 minutes 3 to 4 times per week.  If you develop chest pain or shortness of breath seek  medical attention.  Return in one year for follow-up   

## 2016-05-27 NOTE — Progress Notes (Signed)
Pre visit review using our clinic review tool, if applicable. No additional management support is needed unless otherwise documented below in the visit note. 

## 2016-05-27 NOTE — Progress Notes (Signed)
Subjective:    Patient ID: Todd Huff, male    DOB: January 23, 1961, 55 y.o.   MRN: [id number]  HPI   55 year-old patient who is seen today for a wellness exam.  He does quite well.  No major concerns or complaints today He did have a colonoscopy 6 years ago that did reveal adenomatous polyps; five-year follow-up recommended; .  Follow-up colonoscopy performed in January 2017  .  Since his last visit here, he has had some allergy testing with negative results.  He is also had recent urological follow-up  Past Medical History  Diagnosis Date  . ALLERGIC RHINITIS 06/23/2008  . BENIGN PROSTATIC HYPERTROPHY, HX OF 07/08/2008  . GANGLION CYST, WRIST, LEFT 01/04/2010  . LOW BACK PAIN 06/25/2010  . NEPHROLITHIASIS, HX OF 06/23/2008  . Allergy   . Chronic kidney disease     kidney  . Blood transfusion without reported diagnosis     3 units of his own blood   . Blood in stool     noted 12-22 in stool and toilet paper-? hemorrhoid- BRB    Social History   Social History  . Marital Status: Married    Spouse Name: N/A  . Number of Children: N/A  . Years of Education: N/A   Occupational History  . Not on file.   Social History Main Topics  . Smoking status: Never Smoker   . Smokeless tobacco: Never Used  . Alcohol Use: No  . Drug Use: No  . Sexual Activity: Not on file   Other Topics Concern  . Not on file   Social History Narrative    Past Surgical History  Procedure Laterality Date  . Spermatocelectomy    . Colonoscopy    . Polypectomy    . Ganglion cyst excision Left     wrist     Family History  Problem Relation Age of Onset  . Colon polyps Father   . Colon cancer Neg Hx   . Esophageal cancer Neg Hx   . Rectal cancer Neg Hx   . Stomach cancer Neg Hx   . Colitis Mother   . Diverticulosis Mother   . Colon polyps Mother     Allergies  Allergen Reactions  . Hydrocodone Nausea Only  . Codeine Nausea And Vomiting    Current Outpatient Prescriptions on File  Prior to Visit  Medication Sig Dispense Refill  . ALPRAZolam (XANAX) 0.25 MG tablet TAKE 1 TABLET BY MOUTH AS NEEDED AS DIRECTED 60 tablet 2  . cetirizine (ZYRTEC) 10 MG tablet Take 10 mg by mouth daily.      . propranolol (INDERAL) 10 MG tablet TAKE 1 TABLET BY MOUTH AS NEEDED 60 tablet 5   No current facility-administered medications on file prior to visit.    BP 140/90 mmHg  Pulse 78  Temp(Src) 98.3 F (36.8 C) (Oral)  Ht 5\' 10"  (1.778 m)  Wt 202 lb (91.627 kg)  BMI 28.98 kg/m2  SpO2 98%   Family history of fairly unremarkable father 8 7 8  with history of colonic polyps and vertigo mother has had multiple skin cancers resected.  One sister is well.  Maternal grandmother is 51 Review of Systems  Constitutional: Negative for fever, chills, activity change, appetite change and fatigue.  HENT: Negative for congestion, dental problem, ear pain, hearing loss, mouth sores, rhinorrhea, sinus pressure, sneezing, tinnitus, trouble swallowing and voice change.   Eyes: Negative for photophobia, pain, redness and visual disturbance.  Respiratory: Negative for apnea, cough,  choking, chest tightness, shortness of breath and wheezing.   Cardiovascular: Negative for chest pain, palpitations and leg swelling.  Gastrointestinal: Negative for nausea, vomiting, abdominal pain, diarrhea, constipation, blood in stool, abdominal distention, anal bleeding and rectal pain.  Genitourinary: Negative for dysuria, urgency, frequency, hematuria, flank pain, decreased urine volume, discharge, penile swelling, scrotal swelling, difficulty urinating, genital sores and testicular pain.  Musculoskeletal: Positive for back pain. Negative for myalgias, joint swelling, arthralgias, gait problem, neck pain and neck stiffness.  Skin: Negative for color change, rash and wound.  Neurological: Negative for dizziness, tremors, seizures, syncope, facial asymmetry, speech difficulty, weakness, light-headedness, numbness and  headaches.  Hematological: Negative for adenopathy. Does not bruise/bleed easily.  Psychiatric/Behavioral: Negative for suicidal ideas, hallucinations, behavioral problems, confusion, sleep disturbance, self-injury, dysphoric mood, decreased concentration and agitation. The patient is not nervous/anxious.        Objective:   Physical Exam  Constitutional: He appears well-developed and well-nourished.  Repeat blood pressure 130/82  HENT:  Head: Normocephalic and atraumatic.  Right Ear: External ear normal.  Left Ear: External ear normal.  Nose: Nose normal.  Mouth/Throat: Oropharynx is clear and moist.  Eyes: Conjunctivae and EOM are normal. Pupils are equal, round, and reactive to light. No scleral icterus.  Neck: Normal range of motion. Neck supple. No JVD present. No thyromegaly present.  Cardiovascular: Regular rhythm, normal heart sounds and intact distal pulses.  Exam reveals no gallop and no friction rub.   No murmur heard. Pulmonary/Chest: Effort normal and breath sounds normal. He exhibits no tenderness.  Abdominal: Soft. Bowel sounds are normal. He exhibits no distension and no mass. There is no tenderness.  Genitourinary: Penis normal.  Mild prostate enlargement  Musculoskeletal: Normal range of motion. He exhibits no edema or tenderness.  Lymphadenopathy:    He has no cervical adenopathy.  Neurological: He is alert. He has normal reflexes. No cranial nerve deficit. Coordination normal.  Skin: Skin is warm and dry. No rash noted.  Scattered patchy areas of vitiligo  Psychiatric: He has a normal mood and affect. His behavior is normal.          Assessment & Plan:   Preventive health examination .  Recheck 1 year .  No change in medical therapy .  Medications updated  Nyoka Cowden, MD History colonic polyps.  Will schedule follow-up colonoscopy  In 5 years  Laboratory studies reviewed Recheck one year

## 2016-09-25 DIAGNOSIS — Z23 Encounter for immunization: Secondary | ICD-10-CM | POA: Diagnosis not present

## 2016-12-25 DIAGNOSIS — L821 Other seborrheic keratosis: Secondary | ICD-10-CM | POA: Diagnosis not present

## 2016-12-25 DIAGNOSIS — Z85828 Personal history of other malignant neoplasm of skin: Secondary | ICD-10-CM | POA: Diagnosis not present

## 2016-12-25 DIAGNOSIS — D224 Melanocytic nevi of scalp and neck: Secondary | ICD-10-CM | POA: Diagnosis not present

## 2016-12-25 DIAGNOSIS — L80 Vitiligo: Secondary | ICD-10-CM | POA: Diagnosis not present

## 2017-03-24 DIAGNOSIS — N401 Enlarged prostate with lower urinary tract symptoms: Secondary | ICD-10-CM | POA: Diagnosis not present

## 2017-03-24 DIAGNOSIS — N4340 Spermatocele of epididymis, unspecified: Secondary | ICD-10-CM | POA: Diagnosis not present

## 2017-03-24 DIAGNOSIS — R351 Nocturia: Secondary | ICD-10-CM | POA: Diagnosis not present

## 2017-03-24 DIAGNOSIS — R300 Dysuria: Secondary | ICD-10-CM | POA: Diagnosis not present

## 2017-09-08 ENCOUNTER — Encounter: Payer: Self-pay | Admitting: Internal Medicine

## 2017-09-08 ENCOUNTER — Ambulatory Visit (INDEPENDENT_AMBULATORY_CARE_PROVIDER_SITE_OTHER): Payer: BLUE CROSS/BLUE SHIELD | Admitting: Internal Medicine

## 2017-09-08 VITALS — BP 140/92 | HR 73 | Temp 97.7°F | Ht 69.0 in | Wt 201.0 lb

## 2017-09-08 DIAGNOSIS — Z23 Encounter for immunization: Secondary | ICD-10-CM

## 2017-09-08 DIAGNOSIS — Z0000 Encounter for general adult medical examination without abnormal findings: Secondary | ICD-10-CM | POA: Diagnosis not present

## 2017-09-08 LAB — CBC WITH DIFFERENTIAL/PLATELET
BASOS ABS: 0 10*3/uL (ref 0.0–0.1)
Basophils Relative: 0.9 % (ref 0.0–3.0)
Eosinophils Absolute: 0.1 10*3/uL (ref 0.0–0.7)
Eosinophils Relative: 1.9 % (ref 0.0–5.0)
HCT: 47.6 % (ref 39.0–52.0)
Hemoglobin: 16.1 g/dL (ref 13.0–17.0)
LYMPHS ABS: 1.3 10*3/uL (ref 0.7–4.0)
Lymphocytes Relative: 24 % (ref 12.0–46.0)
MCHC: 33.7 g/dL (ref 30.0–36.0)
MCV: 88.4 fl (ref 78.0–100.0)
Monocytes Absolute: 0.4 10*3/uL (ref 0.1–1.0)
Monocytes Relative: 7.3 % (ref 3.0–12.0)
NEUTROS PCT: 65.9 % (ref 43.0–77.0)
Neutro Abs: 3.4 10*3/uL (ref 1.4–7.7)
Platelets: 176 10*3/uL (ref 150.0–400.0)
RBC: 5.39 Mil/uL (ref 4.22–5.81)
RDW: 13.4 % (ref 11.5–15.5)
WBC: 5.2 10*3/uL (ref 4.0–10.5)

## 2017-09-08 LAB — COMPREHENSIVE METABOLIC PANEL
ALBUMIN: 4.3 g/dL (ref 3.5–5.2)
ALK PHOS: 72 U/L (ref 39–117)
ALT: 16 U/L (ref 0–53)
AST: 17 U/L (ref 0–37)
BILIRUBIN TOTAL: 0.9 mg/dL (ref 0.2–1.2)
BUN: 18 mg/dL (ref 6–23)
CALCIUM: 9 mg/dL (ref 8.4–10.5)
CO2: 31 mEq/L (ref 19–32)
Chloride: 105 mEq/L (ref 96–112)
Creatinine, Ser: 1.14 mg/dL (ref 0.40–1.50)
GFR: 70.18 mL/min (ref >60.00)
GLUCOSE: 100 mg/dL — AB (ref 70–99)
Potassium: 3.9 mEq/L (ref 3.5–5.1)
Sodium: 142 mEq/L (ref 135–145)
TOTAL PROTEIN: 6.3 g/dL (ref 6.0–8.3)

## 2017-09-08 LAB — LIPID PANEL
CHOLESTEROL: 174 mg/dL (ref 0–200)
HDL: 35.3 mg/dL — AB (ref >39.00)
LDL Cholesterol: 111 mg/dL — ABNORMAL HIGH (ref 0–99)
NONHDL: 138.81
TRIGLYCERIDES: 138 mg/dL (ref 0.0–149.0)
Total CHOL/HDL Ratio: 5
VLDL: 27.6 mg/dL (ref 0.0–40.0)

## 2017-09-08 LAB — TSH: TSH: 1.26 u[IU]/mL (ref 0.35–4.50)

## 2017-09-08 LAB — PSA: PSA: 2.93 ng/mL (ref 0.10–4.00)

## 2017-09-08 MED ORDER — ALPRAZOLAM 0.25 MG PO TABS: ORAL_TABLET | ORAL | 2 refills | Status: DC

## 2017-09-08 NOTE — Patient Instructions (Addendum)
Limit your sodium (Salt) intake  Please check your blood pressure on a regular basis.  If it is consistently greater than 130/85, please make an office appointment.    It is important that you exercise regularly, at least 20 minutes 3 to 4 times per week.  If you develop chest pain or shortness of breath seek  medical attention.   DASH Eating Plan DASH stands for "Dietary Approaches to Stop Hypertension." The DASH eating plan is a healthy eating plan that has been shown to reduce high blood pressure (hypertension). It may also reduce your risk for type 2 diabetes, heart disease, and stroke. The DASH eating plan may also help with weight loss. What are tips for following this plan? General guidelines  Avoid eating more than 2,300 mg (milligrams) of salt (sodium) a day. If you have hypertension, you may need to reduce your sodium intake to 1,500 mg a day.  Limit alcohol intake to no more than 1 drink a day for nonpregnant women and 2 drinks a day for men. One drink equals 12 oz of beer, 5 oz of wine, or 1 oz of hard liquor.  Work with your health care provider to maintain a healthy body weight or to lose weight. Ask what an ideal weight is for you.  Get at least 30 minutes of exercise that causes your heart to beat faster (aerobic exercise) most days of the week. Activities may include walking, swimming, or biking.  Work with your health care provider or diet and nutrition specialist (dietitian) to adjust your eating plan to your individual calorie needs. Reading food labels  Check food labels for the amount of sodium per serving. Choose foods with less than 5 percent of the Daily Value of sodium. Generally, foods with less than 300 mg of sodium per serving fit into this eating plan.  To find whole grains, look for the word "whole" as the first word in the ingredient list. Shopping  Buy products labeled as "low-sodium" or "no salt added."  Buy fresh foods. Avoid canned foods and premade  or frozen meals. Cooking  Avoid adding salt when cooking. Use salt-free seasonings or herbs instead of table salt or sea salt. Check with your health care provider or pharmacist before using salt substitutes.  Do not fry foods. Cook foods using healthy methods such as baking, boiling, grilling, and broiling instead.  Cook with heart-healthy oils, such as olive, canola, soybean, or sunflower oil. Meal planning   Eat a balanced diet that includes: ? 5 or more servings of fruits and vegetables each day. At each meal, try to fill half of your plate with fruits and vegetables. ? Up to 6-8 servings of whole grains each day. ? Less than 6 oz of lean meat, poultry, or fish each day. A 3-oz serving of meat is about the same size as a deck of cards. One egg equals 1 oz. ? 2 servings of low-fat dairy each day. ? A serving of nuts, seeds, or beans 5 times each week. ? Heart-healthy fats. Healthy fats called Omega-3 fatty acids are found in foods such as flaxseeds and coldwater fish, like sardines, salmon, and mackerel.  Limit how much you eat of the following: ? Canned or prepackaged foods. ? Food that is high in trans fat, such as fried foods. ? Food that is high in saturated fat, such as fatty meat. ? Sweets, desserts, sugary drinks, and other foods with added sugar. ? Full-fat dairy products.  Do not salt foods  before eating.  Try to eat at least 2 vegetarian meals each week.  Eat more home-cooked food and less restaurant, buffet, and fast food.  When eating at a restaurant, ask that your food be prepared with less salt or no salt, if possible. What foods are recommended? The items listed may not be a complete list. Talk with your dietitian about what dietary choices are best for you. Grains Whole-grain or whole-wheat bread. Whole-grain or whole-wheat pasta. Brown rice. [place]ena Morrow. Bulgur. Whole-grain and low-sodium cereals. Pita bread. Low-fat, low-sodium crackers. Whole-wheat flour  tortillas. Vegetables Fresh or frozen vegetables (raw, steamed, roasted, or grilled). Low-sodium or reduced-sodium tomato and vegetable juice. Low-sodium or reduced-sodium tomato sauce and tomato paste. Low-sodium or reduced-sodium canned vegetables. Fruits All fresh, dried, or frozen fruit. Canned fruit in natural juice (without added sugar). Meat and other protein foods Skinless chicken or [place]. Ground chicken or [place]. Pork with fat trimmed off. Fish and seafood. Egg whites. Dried beans, peas, or lentils. Unsalted nuts, nut butters, and seeds. Unsalted canned beans. Lean cuts of beef with fat trimmed off. Low-sodium, lean deli meat. Dairy Low-fat (1%) or fat-free (skim) milk. Fat-free, low-fat, or reduced-fat cheeses. Nonfat, low-sodium ricotta or cottage cheese. Low-fat or nonfat yogurt. Low-fat, low-sodium cheese. Fats and oils Soft margarine without trans fats. Vegetable oil. Low-fat, reduced-fat, or light mayonnaise and salad dressings (reduced-sodium). Canola, safflower, olive, soybean, and sunflower oils. Avocado. Seasoning and other foods Herbs. Spices. Seasoning mixes without salt. Unsalted popcorn and pretzels. Fat-free sweets. What foods are not recommended? The items listed may not be a complete list. Talk with your dietitian about what dietary choices are best for you. Grains Baked goods made with fat, such as croissants, muffins, or some breads. Dry pasta or rice meal packs. Vegetables Creamed or fried vegetables. Vegetables in a cheese sauce. Regular canned vegetables (not low-sodium or reduced-sodium). Regular canned tomato sauce and paste (not low-sodium or reduced-sodium). Regular tomato and vegetable juice (not low-sodium or reduced-sodium). Angie Fava. Olives. Fruits Canned fruit in a light or heavy syrup. Fried fruit. Fruit in cream or butter sauce. Meat and other protein foods Fatty cuts of meat. Ribs. Fried meat. Berniece Salines. Sausage. Bologna and other processed lunch meats.  Salami. Fatback. Hotdogs. Bratwurst. Salted nuts and seeds. Canned beans with added salt. Canned or smoked fish. Whole eggs or egg yolks. Chicken or [place] with skin. Dairy Whole or 2% milk, cream, and half-and-half. Whole or full-fat cream cheese. Whole-fat or sweetened yogurt. Full-fat cheese. Nondairy creamers. Whipped toppings. Processed cheese and cheese spreads. Fats and oils Butter. Stick margarine. Lard. Shortening. Ghee. Bacon fat. Tropical oils, such as coconut, palm kernel, or palm oil. Seasoning and other foods Salted popcorn and pretzels. Onion salt, garlic salt, seasoned salt, table salt, and sea salt. Worcestershire sauce. Tartar sauce. Barbecue sauce. Teriyaki sauce. Soy sauce, including reduced-sodium. Steak sauce. Canned and packaged gravies. Fish sauce. Oyster sauce. Cocktail sauce. Horseradish that you find on the shelf. Ketchup. Mustard. Meat flavorings and tenderizers. Bouillon cubes. Hot sauce and Tabasco sauce. Premade or packaged marinades. Premade or packaged taco seasonings. Relishes. Regular salad dressings. Where to find more information:  National Heart, Lung, and [place]: https://wilson-eaton.com/  American Heart Association: www.heart.org Summary  The DASH eating plan is a healthy eating plan that has been shown to reduce high blood pressure (hypertension). It may also reduce your risk for type 2 diabetes, heart disease, and stroke.  With the DASH eating plan, you should limit salt (sodium) intake to 2,300 mg  a day. If you have hypertension, you may need to reduce your sodium intake to 1,500 mg a day.  When on the DASH eating plan, aim to eat more fresh fruits and vegetables, whole grains, lean proteins, low-fat dairy, and heart-healthy fats.  Work with your health care provider or diet and nutrition specialist (dietitian) to adjust your eating plan to your individual calorie needs.

## 2017-09-08 NOTE — Progress Notes (Signed)
Subjective:    Patient ID: Todd Huff, male    DOB: June 07, 1961, 56 y.o.   MRN: [id number]  HPI BP Readings from Last 3 Encounters:  09/08/17 (!) 140/92  05/27/16 140/90  03/15/16 (!) 76/68   56 year old patient who is seen today for a preventive health examination. He had colonoscopy in January 2017 that revealed a small hyperplastic polyp.  A 10 year interval was recommended No concerns or complaints  Past Medical History:  Diagnosis Date  . ALLERGIC RHINITIS 06/23/2008  . Allergy   . BENIGN PROSTATIC HYPERTROPHY, HX OF 07/08/2008  . Blood in stool    noted 12-22 in stool and toilet paper-? hemorrhoid- BRB  . Blood transfusion without reported diagnosis    3 units of his own blood   . Chronic kidney disease    kidney  . GANGLION CYST, WRIST, LEFT 01/04/2010  . LOW BACK PAIN 06/25/2010  . NEPHROLITHIASIS, HX OF 06/23/2008     Social History   Social History  . Marital status: Married    Spouse name: N/A  . Number of children: N/A  . Years of education: N/A   Occupational History  . Not on file.   Social History Main Topics  . Smoking status: Never Smoker  . Smokeless tobacco: Never Used  . Alcohol use No  . Drug use: No  . Sexual activity: Not on file   Other Topics Concern  . Not on file   Social History Narrative  . No narrative on file    Past Surgical History:  Procedure Laterality Date  . COLONOSCOPY    . GANGLION CYST EXCISION Left    wrist   . POLYPECTOMY    . SPERMATOCELECTOMY      Family History  Problem Relation Age of Onset  . Colon polyps Father   . Colon cancer Neg Hx   . Esophageal cancer Neg Hx   . Rectal cancer Neg Hx   . Stomach cancer Neg Hx   . Colitis Mother   . Diverticulosis Mother   . Colon polyps Mother     Allergies  Allergen Reactions  . Hydrocodone Nausea Only  . Codeine Nausea And Vomiting    Current Outpatient Prescriptions on File Prior to Visit  Medication Sig Dispense Refill  . ALPRAZolam (XANAX) 0.25  MG tablet TAKE 1 TABLET BY MOUTH AS NEEDED AS DIRECTED 60 tablet 2  . cetirizine (ZYRTEC) 10 MG tablet Take 10 mg by mouth daily.      . propranolol (INDERAL) 10 MG tablet TAKE 1 TABLET BY MOUTH AS NEEDED 60 tablet 5   No current facility-administered medications on file prior to visit.     BP (!) 140/92 (BP Location: Right Arm, Patient Position: Sitting, Cuff Size: Normal)   Pulse 73   Temp 97.7 F (36.5 C) (Oral)   Ht 5\' 9"  (1.753 m)   Wt 201 lb (91.2 kg)   BMI 29.68 kg/m      Review of Systems  Constitutional: Negative for activity change, appetite change, chills, fatigue and fever.  HENT: Positive for congestion and sinus pressure. Negative for dental problem, ear pain, hearing loss, mouth sores, rhinorrhea, sneezing, tinnitus, trouble swallowing and voice change.   Eyes: Negative for photophobia, pain, redness and visual disturbance.  Respiratory: Negative for apnea, cough, choking, chest tightness, shortness of breath and wheezing.   Cardiovascular: Negative for chest pain, palpitations and leg swelling.  Gastrointestinal: Negative for abdominal distention, abdominal pain, anal bleeding, blood in stool, constipation,  diarrhea, nausea, rectal pain and vomiting.  Genitourinary: Negative for decreased urine volume, difficulty urinating, discharge, dysuria, flank pain, frequency, genital sores, hematuria, penile swelling, scrotal swelling, testicular pain and urgency.  Musculoskeletal: Positive for back pain. Negative for arthralgias, gait problem, joint swelling, myalgias, neck pain and neck stiffness.  Skin: Negative for color change, rash and wound.  Neurological: Negative for dizziness, tremors, seizures, syncope, facial asymmetry, speech difficulty, weakness, light-headedness, numbness and headaches.  Hematological: Negative for adenopathy. Does not bruise/bleed easily.  Psychiatric/Behavioral: Negative for agitation, behavioral problems, confusion, decreased concentration,  dysphoric mood, hallucinations, self-injury, sleep disturbance and suicidal ideas. The patient is nervous/anxious.        Objective:   Physical Exam  Constitutional: He appears well-developed and well-nourished.  Blood pressures consistently 140 over 90  HENT:  Head: Normocephalic and atraumatic.  Right Ear: External ear normal.  Left Ear: External ear normal.  Nose: Nose normal.  Mouth/Throat: Oropharynx is clear and moist.  Eyes: Pupils are equal, round, and reactive to light. Conjunctivae and EOM are normal. No scleral icterus.  Neck: Normal range of motion. Neck supple. No JVD present. No thyromegaly present.  Cardiovascular: Regular rhythm, normal heart sounds and intact distal pulses.  Exam reveals no gallop and no friction rub.   No murmur heard. Pulmonary/Chest: Effort normal and breath sounds normal. He exhibits no tenderness.  Abdominal: Soft. Bowel sounds are normal. He exhibits no distension and no mass. There is no tenderness.  Genitourinary: Penis normal.  Genitourinary Comments: Prostate mildly enlarged  Musculoskeletal: Normal range of motion. He exhibits no edema or tenderness.  Lymphadenopathy:    He has no cervical adenopathy.  Neurological: He is alert. He has normal reflexes. No cranial nerve deficit. Coordination normal.  Skin: Skin is warm and dry. No rash noted.  vitiligo  Psychiatric: He has a normal mood and affect. His behavior is normal.          Assessment & Plan:   Preventive health examination Mild hypertension.  Will place on a DASH diet.  Nonpharmacologic therapy discussed including more exercise and modest weight loss.  Home blood pressure monitoring.  Encouraged History of renal stones History of hyperplastic colonic polyps.  Follow-up colonoscopy in 2027  Will review updated lab  [facility]

## 2017-09-11 DIAGNOSIS — D225 Melanocytic nevi of trunk: Secondary | ICD-10-CM | POA: Diagnosis not present

## 2017-09-11 DIAGNOSIS — Z85828 Personal history of other malignant neoplasm of skin: Secondary | ICD-10-CM | POA: Diagnosis not present

## 2017-09-11 DIAGNOSIS — D1801 Hemangioma of skin and subcutaneous tissue: Secondary | ICD-10-CM | POA: Diagnosis not present

## 2017-09-11 DIAGNOSIS — L80 Vitiligo: Secondary | ICD-10-CM | POA: Diagnosis not present

## 2017-10-16 ENCOUNTER — Telehealth: Payer: Self-pay | Admitting: *Deleted

## 2017-10-16 NOTE — Telephone Encounter (Signed)
MD signed form, place a copy to be scanned in pt's chart. Wife will pick up form at her OV today.

## 2017-10-16 NOTE — Telephone Encounter (Signed)
Patient here for completion of biometric screening form for health insurance.  BP checked for form completion, readings 148/98, 142/98, and 140/98.  Form completed and forwarded to provider for signature. Form will be picked up by pt's wife this afternoon.   Instructed patient to schedule a follow-up appointment with PCP due to elevated BP. Per last office visit note: Please check your blood pressure on a regular basis.  If it is consistently greater than 130/85, please make an office appointment.

## 2017-10-16 NOTE — Telephone Encounter (Signed)
Form was given to Mrs. [employer]

## 2017-12-16 DIAGNOSIS — R972 Elevated prostate specific antigen [PSA]: Secondary | ICD-10-CM

## 2017-12-16 HISTORY — DX: Elevated prostate specific antigen (PSA): R97.20

## 2017-12-25 DIAGNOSIS — L80 Vitiligo: Secondary | ICD-10-CM | POA: Diagnosis not present

## 2017-12-25 DIAGNOSIS — D224 Melanocytic nevi of scalp and neck: Secondary | ICD-10-CM | POA: Diagnosis not present

## 2017-12-25 DIAGNOSIS — Z85828 Personal history of other malignant neoplasm of skin: Secondary | ICD-10-CM | POA: Diagnosis not present

## 2017-12-25 DIAGNOSIS — D1801 Hemangioma of skin and subcutaneous tissue: Secondary | ICD-10-CM | POA: Diagnosis not present

## 2018-04-11 ENCOUNTER — Other Ambulatory Visit: Payer: Self-pay | Admitting: Internal Medicine

## 2018-06-16 DIAGNOSIS — L301 Dyshidrosis [pompholyx]: Secondary | ICD-10-CM | POA: Diagnosis not present

## 2018-06-16 DIAGNOSIS — L718 Other rosacea: Secondary | ICD-10-CM | POA: Diagnosis not present

## 2018-07-28 DIAGNOSIS — L309 Dermatitis, unspecified: Secondary | ICD-10-CM | POA: Diagnosis not present

## 2018-09-15 DIAGNOSIS — E7800 Pure hypercholesterolemia, unspecified: Secondary | ICD-10-CM

## 2018-09-15 HISTORY — DX: Pure hypercholesterolemia, unspecified: E78.00

## 2018-09-23 ENCOUNTER — Encounter: Payer: Self-pay | Admitting: Family Medicine

## 2018-09-23 ENCOUNTER — Ambulatory Visit: Payer: BLUE CROSS/BLUE SHIELD | Admitting: Family Medicine

## 2018-09-23 VITALS — BP 145/75 | HR 62 | Temp 98.5°F | Resp 16 | Ht 69.75 in | Wt 205.5 lb

## 2018-09-23 DIAGNOSIS — I10 Essential (primary) hypertension: Secondary | ICD-10-CM | POA: Diagnosis not present

## 2018-09-23 DIAGNOSIS — E663 Overweight: Secondary | ICD-10-CM

## 2018-09-23 DIAGNOSIS — Z0000 Encounter for general adult medical examination without abnormal findings: Secondary | ICD-10-CM | POA: Diagnosis not present

## 2018-09-23 DIAGNOSIS — Z125 Encounter for screening for malignant neoplasm of prostate: Secondary | ICD-10-CM | POA: Diagnosis not present

## 2018-09-23 DIAGNOSIS — R21 Rash and other nonspecific skin eruption: Secondary | ICD-10-CM

## 2018-09-23 NOTE — Progress Notes (Signed)
Office Note 09/24/2018  CC:  Chief Complaint  Patient presents with  . Transfer of Care    Previous PCP: Dr. Burnice Logan  . Annual Exam    Pt is not fasting.     HPI:  Todd Huff is a 57 y.o.  male who is here to transfer care and get annual health maintenance exam b/c his current [facility] PCP, Dr. Burnice Logan, is retiring.  Old records in [facility] EMR were reviewed prior to or during today's visit.  Exercise: walking daily about 1/2 mile. Diet: "pretty normal", "too much red meat", working on cutting back on fried foods. Eyes: UTD, annual. Dental UTD.  He says he has had a conversation in the past with Dr. Raliegh Ip about his bp creeping up.  TLC discussed, no meds. Pt occ checks bp outside of medical setting and it is usually in the range of 130-140 over 75-85.  Skin complaint: for the last 6 wks he has had recurrent eruptions on skin of hands/fingers. Starts as tiny pinkish blisters and these break and then a lot of itching starts and the area peels a little and becomes dry and cracked.  The cracked areas eventually start to hurt.  He saw his derm MD, Dr. Ronnald Ramp, but treatment with topical steroid (for eczema vs psoriasis per pt report today) did not help any.  Vigorous moisturazation no help.  No known contact irritants or allergens that he's been exposed to.  No new meds prior to onset of rash.  No changes in his fingernails. No rash anywhere else on his body.  Past Medical History:  Diagnosis Date  . ALLERGIC RHINITIS 06/23/2008  . Anxiety   . BENIGN PROSTATIC HYPERTROPHY, HX OF 07/08/2008  . Blood in stool    noted 12-22 in stool and toilet paper-? hemorrhoid- BRB  . Blood transfusion without reported diagnosis    3 units of his own blood   . Insomnia   . LOW BACK PAIN 06/25/2010  . NEPHROLITHIASIS, HX OF 06/23/2008    Past Surgical History:  Procedure Laterality Date  . COLONOSCOPY  2011;2017   2011 adenomatous polyp.  12/29/15 hyperplastic polyps.  Recall 10 yrs.  Marland Kitchen  GANGLION CYST EXCISION Left    wrist   . SPERMATOCELECTOMY      Family History  Problem Relation Age of Onset  . Colon polyps Father   . Colitis Mother   . Diverticulosis Mother   . Colon polyps Mother   . Colon cancer Neg Hx   . Esophageal cancer Neg Hx   . Rectal cancer Neg Hx   . Stomach cancer Neg Hx     Social History   Socioeconomic History  . Marital status: Married    Spouse name: Not on file  . Number of children: Not on file  . Years of education: Not on file  . Highest education level: Not on file  Occupational History  . Not on file  Social Needs  . Financial resource strain: Not on file  . Food insecurity:    Worry: Not on file    Inability: Not on file  . Transportation needs:    Medical: Not on file    Non-medical: Not on file  Tobacco Use  . Smoking status: Never Smoker  . Smokeless tobacco: Never Used  Substance and Sexual Activity  . Alcohol use: No    Alcohol/week: 0.0 standard drinks  . Drug use: No  . Sexual activity: Not on file  Lifestyle  . Physical activity:  Days per week: Not on file    Minutes per session: Not on file  . Stress: Not on file  Relationships  . Social connections:    Talks on phone: Not on file    Gets together: Not on file    Attends religious service: Not on file    Active member of club or organization: Not on file    Attends meetings of clubs or organizations: Not on file    Relationship status: Not on file  . Intimate partner violence:    Fear of current or ex partner: Not on file    Emotionally abused: Not on file    Physically abused: Not on file    Forced sexual activity: Not on file  Other Topics Concern  . Not on file  Social History Narrative   Married, 2 grown sons 2 adult daughters.   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   Orig from [place], [place] since his 32s.   No tobacco.   No alcohol.    Outpatient Encounter Medications as of 09/23/2018  Medication Sig  . ALPRAZolam (XANAX) 0.25  MG tablet TAKE 1 TABLET BY MOUTH AS NEEDED AS DIRECTED  . fluticasone (FLONASE) 50 MCG/ACT nasal spray Place 1 spray into both nostrils daily.  . propranolol (INDERAL) 10 MG tablet TAKE 1 TABLET BY MOUTH AS NEEDED  . [DISCONTINUED] cetirizine (ZYRTEC) 10 MG tablet Take 10 mg by mouth daily.    . [DISCONTINUED] propranolol (INDERAL) 10 MG tablet TAKE 1 TABLET BY MOUTH AS NEEDED (Patient not taking: Reported on 09/23/2018)   No facility-administered encounter medications on file as of 09/23/2018.     Allergies  Allergen Reactions  . Hydrocodone Nausea Only  . Codeine Nausea And Vomiting    ROS Review of Systems  Constitutional: Negative for appetite change, chills, fatigue and fever.  HENT: Negative for congestion, dental problem, ear pain and sore throat.   Eyes: Negative for discharge, redness and visual disturbance.  Respiratory: Negative for cough, chest tightness, shortness of breath and wheezing.   Cardiovascular: Negative for chest pain, palpitations and leg swelling.  Gastrointestinal: Negative for abdominal pain, blood in stool, diarrhea, nausea and vomiting.  Genitourinary: Negative for difficulty urinating, dysuria, flank pain, frequency, hematuria and urgency.  Musculoskeletal: Negative for arthralgias, back pain, joint swelling, myalgias and neck stiffness.  Skin: Positive for rash (itchy rash on hands x 6 mo--derm said it was "psoriasis or eczema,"). Negative for pallor.  Neurological: Negative for dizziness, speech difficulty, weakness and headaches.  Hematological: Negative for adenopathy. Does not bruise/bleed easily.  Psychiatric/Behavioral: Negative for confusion and sleep disturbance. The patient is not nervous/anxious.     PE; Blood pressure (!) 145/75, pulse 62, temperature 98.5 F (36.9 C), temperature source Oral, resp. rate 16, height 5' 9.75" (1.772 m), weight 205 lb 8 oz (93.2 kg), SpO2 96 %. .Gen: Alert, well appearing.  Patient is oriented to person, place,  time, and situation. AFFECT: pleasant, lucid thought and speech. ENT: Ears: EACs clear, normal epithelium.  TMs with good light reflex and landmarks bilaterally.  Eyes: no injection, icteris, swelling, or exudate.  EOMI, PERRLA. Nose: no drainage or turbinate edema/swelling.  No injection or focal lesion.  Mouth: lips without lesion/swelling.  Oral mucosa pink and moist.  Dentition intact and without obvious caries or gingival swelling.  Oropharynx without erythema, exudate, or swelling.  Neck: supple/nontender.  No LAD, mass, or TM.  Carotid pulses 2+ bilaterally, without bruits. CV: RRR, no m/r/g.  LUNGS: CTA bilat, nonlabored resps, good aeration in all lung fields. ABD: soft, NT, ND, BS normal.  No hepatospenomegaly or mass.  No bruits. EXT: no clubbing, cyanosis, or edema.  Musculoskeletal: no joint swelling, erythema, warmth, or tenderness.  ROM of all joints intact. Skin - no sores or suspicious lesion.  He does have scattered patches of vitiligo skin discoloration. Both hands have some scattered patches of flaky, fissured plaques with no erythema or maceration.  A couple of fissures have a small scab in center.  No swelling. Rectal exam: negative without mass, lesions or tenderness, PROSTATE EXAM: smooth and symmetric without nodules or tenderness.   Pertinent labs:    Lab Results  Component Value Date   TSH 1.26 09/08/2017   Lab Results  Component Value Date   WBC 5.2 09/08/2017   HGB 16.1 09/08/2017   HCT 47.6 09/08/2017   MCV 88.4 09/08/2017   PLT 176.0 09/08/2017   Lab Results  Component Value Date   CREATININE 1.14 09/08/2017   BUN 18 09/08/2017   NA 142 09/08/2017   K 3.9 09/08/2017   CL 105 09/08/2017   CO2 31 09/08/2017  GFR 09/08/17= 70 ml/min  Lab Results  Component Value Date   ALT 16 09/08/2017   AST 17 09/08/2017   ALKPHOS 72 09/08/2017   BILITOT 0.9 09/08/2017   Lab Results  Component Value Date   CHOL 174 09/08/2017   Lab Results  Component  Value Date   HDL 35.30 (L) 09/08/2017   Lab Results  Component Value Date   LDLCALC 111 (H) 09/08/2017   Lab Results  Component Value Date   TRIG 138.0 09/08/2017   Lab Results  Component Value Date   CHOLHDL 5 09/08/2017   Lab Results  Component Value Date   PSA 2.93 09/08/2017   PSA 2.74 05/20/2016   PSA 1.42 02/17/2015    ASSESSMENT AND PLAN:   New pt: 1) HTN: mild.  Had extensive discussion about continuing monitoring with ongoing TLC efforts vs starting med. We decided to continue with TLC and home bp monitoring, with goal of 130/80 or better as his average.  If he does not get to this point in the next 6-12 mo, he is agreeable to starting antihypertensive.  2) Hand rash: eczematous rash.  Unresponsive to topical steroids.   He likes his current dermatologist and plans on staying with him. However, he plans on making an appt with a dermatologist in [place] for second opinion.  3) Health maintenance exam: Reviewed age and gender appropriate health maintenance issues (prudent diet, regular exercise, health risks of tobacco and excessive alcohol, use of seatbelts, fire alarms in home, use of sunscreen).  Also reviewed age and gender appropriate health screening as well as vaccine recommendations. Vaccines: has had seasonal flu already.  Shingrix-->he'll consider, check with insurance. Labs: return when fasting for HP + PSA. Prostate ca screening:  DRE normal today , PSA. Colon ca screening: UTD.  Next colonoscopy due 2027.  An After Visit Summary was printed and given to the patient.  Return in about 1 year (around 09/24/2019) for annual CPE (fasting)--also lab appt at his convenience for fasting labs.  Signed:  Crissie Sickles, MD           09/24/2018

## 2018-09-23 NOTE — Patient Instructions (Signed)

## 2018-09-28 ENCOUNTER — Encounter: Payer: Self-pay | Admitting: Family Medicine

## 2018-09-28 ENCOUNTER — Other Ambulatory Visit (INDEPENDENT_AMBULATORY_CARE_PROVIDER_SITE_OTHER): Payer: BLUE CROSS/BLUE SHIELD

## 2018-09-28 ENCOUNTER — Telehealth: Payer: Self-pay | Admitting: Family Medicine

## 2018-09-28 DIAGNOSIS — Z0000 Encounter for general adult medical examination without abnormal findings: Secondary | ICD-10-CM

## 2018-09-28 DIAGNOSIS — Z125 Encounter for screening for malignant neoplasm of prostate: Secondary | ICD-10-CM

## 2018-09-28 LAB — CBC WITH DIFFERENTIAL/PLATELET
BASOS PCT: 0.8 % (ref 0.0–3.0)
Basophils Absolute: 0 10*3/uL (ref 0.0–0.1)
Eosinophils Absolute: 0.1 10*3/uL (ref 0.0–0.7)
Eosinophils Relative: 2 % (ref 0.0–5.0)
HEMATOCRIT: 47.7 % (ref 39.0–52.0)
HEMOGLOBIN: 16.1 g/dL (ref 13.0–17.0)
LYMPHS PCT: 29.5 % (ref 12.0–46.0)
Lymphs Abs: 1.3 10*3/uL (ref 0.7–4.0)
MCHC: 33.9 g/dL (ref 30.0–36.0)
MCV: 88 fl (ref 78.0–100.0)
MONOS PCT: 8.2 % (ref 3.0–12.0)
Monocytes Absolute: 0.4 10*3/uL (ref 0.1–1.0)
NEUTROS ABS: 2.7 10*3/uL (ref 1.4–7.7)
Neutrophils Relative %: 59.5 % (ref 43.0–77.0)
Platelets: 165 10*3/uL (ref 150.0–400.0)
RBC: 5.42 Mil/uL (ref 4.22–5.81)
RDW: 13.4 % (ref 11.5–15.5)
WBC: 4.5 10*3/uL (ref 4.0–10.5)

## 2018-09-28 LAB — COMPREHENSIVE METABOLIC PANEL
ALBUMIN: 4.3 g/dL (ref 3.5–5.2)
ALT: 17 U/L (ref 0–53)
AST: 13 U/L (ref 0–37)
Alkaline Phosphatase: 75 U/L (ref 39–117)
BILIRUBIN TOTAL: 0.8 mg/dL (ref 0.2–1.2)
BUN: 18 mg/dL (ref 6–23)
CALCIUM: 9 mg/dL (ref 8.4–10.5)
CO2: 32 mEq/L (ref 19–32)
CREATININE: 1.22 mg/dL (ref 0.40–1.50)
Chloride: 104 mEq/L (ref 96–112)
GFR: 64.66 mL/min (ref >60.00)
Glucose, Bld: 99 mg/dL (ref 70–99)
Potassium: 4.2 mEq/L (ref 3.5–5.1)
Sodium: 139 mEq/L (ref 135–145)
Total Protein: 6.4 g/dL (ref 6.0–8.3)

## 2018-09-28 LAB — LIPID PANEL
CHOLESTEROL: 186 mg/dL (ref 0–200)
HDL: 37.3 mg/dL — AB (ref >39.00)
LDL Cholesterol: 119 mg/dL — ABNORMAL HIGH (ref 0–99)
NonHDL: 148.28
TRIGLYCERIDES: 145 mg/dL (ref 0.0–149.0)
Total CHOL/HDL Ratio: 5
VLDL: 29 mg/dL (ref 0.0–40.0)

## 2018-09-28 LAB — PSA: PSA: 3.84 ng/mL (ref 0.10–4.00)

## 2018-09-28 LAB — TSH: TSH: 1.99 u[IU]/mL (ref 0.35–4.50)

## 2018-09-28 NOTE — Telephone Encounter (Signed)
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

## 2018-09-29 NOTE — Telephone Encounter (Signed)
Form placed on Dr. McGowens desk for review/completion.  

## 2018-09-30 ENCOUNTER — Encounter: Payer: Self-pay | Admitting: Family Medicine

## 2018-09-30 NOTE — Telephone Encounter (Signed)
Form completed and handed to [facility].

## 2018-10-01 NOTE — Telephone Encounter (Signed)
Pt advised form ready for p/u. Copy made for chart and original put up front for p/u.

## 2018-10-05 ENCOUNTER — Encounter: Payer: Self-pay | Admitting: Family Medicine

## 2018-10-05 ENCOUNTER — Ambulatory Visit (INDEPENDENT_AMBULATORY_CARE_PROVIDER_SITE_OTHER): Payer: BLUE CROSS/BLUE SHIELD | Admitting: Family Medicine

## 2018-10-05 VITALS — BP 154/74 | HR 61 | Temp 98.3°F | Resp 16 | Ht 69.75 in | Wt 202.2 lb

## 2018-10-05 DIAGNOSIS — R972 Elevated prostate specific antigen [PSA]: Secondary | ICD-10-CM | POA: Diagnosis not present

## 2018-10-05 DIAGNOSIS — E7800 Pure hypercholesterolemia, unspecified: Secondary | ICD-10-CM | POA: Diagnosis not present

## 2018-10-05 DIAGNOSIS — I10 Essential (primary) hypertension: Secondary | ICD-10-CM | POA: Diagnosis not present

## 2018-10-05 MED ORDER — ATORVASTATIN CALCIUM 20 MG PO TABS: 20.0000 mg | ORAL_TABLET | Freq: Every day | ORAL | 3 refills | Status: DC

## 2018-10-05 MED ORDER — LISINOPRIL 5 MG PO TABS: 5.0000 mg | ORAL_TABLET | Freq: Every day | ORAL | 1 refills | Status: DC

## 2018-10-05 NOTE — Progress Notes (Signed)
OFFICE VISIT  10/05/2018   CC:  Chief Complaint  Patient presents with  . Lab Results   HPI:    Patient is a 57 y.o. Caucasian male who presents for discussion of lab results: recent hypercholesterolemia and increased PSA velocity on 09/28/18.  Reviewed cholesterol panel and PSAs specifically: PSA rose almost a whole point over the last year. He has a urologist.  Pt's Framingham CV risk = 10%, so I had recommended a statin.  We reviewed dietary changes recommended AND risks/benefits of statin medication.    HTN: he is ready to start taking antihypertensive med at this time instead of waiting like we had decided at recent visit.  Past Medical History:  Diagnosis Date  . ALLERGIC RHINITIS 06/23/2008  . Anxiety   . BENIGN PROSTATIC HYPERTROPHY, HX OF 07/08/2008  . Blood in stool    noted 12-22 in stool and toilet paper-? hemorrhoid- BRB  . Blood transfusion without reported diagnosis    3 units of his own blood   . Hypercholesterolemia 09/2018   Recommended statin 09/2018; pt elected to do TLC  . Increased prostate specific antigen (PSA) velocity 2019   Pt notified 09/2018 and he was going to get in with his urologist for this.  . Insomnia   . LOW BACK PAIN 06/25/2010  . NEPHROLITHIASIS, HX OF 06/23/2008    Past Surgical History:  Procedure Laterality Date  . COLONOSCOPY  2011;2017   2011 adenomatous polyp.  12/29/15 hyperplastic polyps.  Recall 10 yrs.  Marland Kitchen GANGLION CYST EXCISION Left    wrist   . SPERMATOCELECTOMY      Outpatient Medications Prior to Visit  Medication Sig Dispense Refill  . ALPRAZolam (XANAX) 0.25 MG tablet TAKE 1 TABLET BY MOUTH AS NEEDED AS DIRECTED 60 tablet 2  . fluticasone (FLONASE) 50 MCG/ACT nasal spray Place 1 spray into both nostrils daily.    . propranolol (INDERAL) 10 MG tablet TAKE 1 TABLET BY MOUTH AS NEEDED 60 tablet 5   No facility-administered medications prior to visit.     Allergies  Allergen Reactions  . Hydrocodone Nausea Only  .  Codeine Nausea And Vomiting    ROS As per HPI  PE: Blood pressure (!) 154/74, pulse 61, temperature 98.3 F (36.8 C), temperature source Oral, resp. rate 16, height 5' 9.75" (1.772 m), weight 202 lb 4 oz (91.7 kg), SpO2 97 %. Gen: Alert, well appearing.  Patient is oriented to person, place, time, and situation. AFFECT: pleasant, lucid thought and speech. No further exam today.  LABS:  Lab Results  Component Value Date   TSH 1.99 09/28/2018   Lab Results  Component Value Date   WBC 4.5 09/28/2018   HGB 16.1 09/28/2018   HCT 47.7 09/28/2018   MCV 88.0 09/28/2018   PLT 165.0 09/28/2018   Lab Results  Component Value Date   CREATININE 1.22 09/28/2018   BUN 18 09/28/2018   NA 139 09/28/2018   K 4.2 09/28/2018   CL 104 09/28/2018   CO2 32 09/28/2018   Lab Results  Component Value Date   ALT 17 09/28/2018   AST 13 09/28/2018   ALKPHOS 75 09/28/2018   BILITOT 0.8 09/28/2018   Lab Results  Component Value Date   CHOL 186 09/28/2018   Lab Results  Component Value Date   HDL 37.30 (L) 09/28/2018   Lab Results  Component Value Date   LDLCALC 119 (H) 09/28/2018   Lab Results  Component Value Date   TRIG 145.0 09/28/2018  Lab Results  Component Value Date   CHOLHDL 5 09/28/2018   Lab Results  Component Value Date   PSA 3.84 09/28/2018   PSA 2.93 09/08/2017   PSA 2.74 05/20/2016    IMPRESSION AND PLAN:  1) HTN: start lisinopril 5mg  qd.  Therapeutic expectations and side effect profile of medication discussed today.  Patient's questions answered. Monitor home bp/hr.  2) Hypercholesterolemia: start atorvastatin 20mg  qd---but wait until he has been on lisinopril for 1 mo before starting this med.  Therapeutic expectations and side effect profile of medication discussed today.  Patient's questions answered.  3) Increased PSA velocity: PSA in normal range, but the rise over the last year is above normal rate of rise. He'll make an appt with his urologist to  f/u this problem.  Spent 15 min with pt today, with >50% of this time spent in counseling and care coordination regarding the above problems.  An After Visit Summary was printed and given to the patient.  FOLLOW UP: Return in about 4 weeks (around 11/02/2018) for f/u HTN.  Signed:  Crissie Sickles, MD           10/05/2018

## 2018-10-13 DIAGNOSIS — R972 Elevated prostate specific antigen [PSA]: Secondary | ICD-10-CM | POA: Diagnosis not present

## 2018-10-22 ENCOUNTER — Telehealth: Payer: Self-pay | Admitting: *Deleted

## 2018-10-22 NOTE — Telephone Encounter (Signed)
SW pt, he stated that his BP has been running 135-140/80-90, HR in 60-70's. He has not had an side effects from the lisinopril. He did mention feeling pressure/tenson in his head. He has a f/u apt on 11/06/18. He wants to know if Dr. Anitra Lauth will make adjustments so that he doesn't have to f/u several times.   Please advise. Thanks.

## 2018-10-22 NOTE — Telephone Encounter (Signed)
Pt advised and voiced understanding.   

## 2018-10-22 NOTE — Telephone Encounter (Signed)
Increase dose to TWO of the lisinopril 5mg  tabs once a day. Call in 1 wk with bp report.-thx

## 2018-10-22 NOTE — Telephone Encounter (Signed)
Copied from [place] [id number]. Topic: General - Other >> Oct 22, 2018  8:09 AM Keene Breath wrote: Reason for CRM: Patient called to inform the doctor that the BP medication that he is using now has not really affected his readings with BP.  He feels that the medication should be changed before his appointment so that the doctor could review at his appointment.  Please advise and call patient back at [phone]

## 2018-10-22 NOTE — Telephone Encounter (Signed)
Left message for pt to call back  °

## 2018-10-25 ENCOUNTER — Encounter: Payer: Self-pay | Admitting: Family Medicine

## 2018-11-06 ENCOUNTER — Encounter: Payer: Self-pay | Admitting: Family Medicine

## 2018-11-06 ENCOUNTER — Ambulatory Visit: Payer: BLUE CROSS/BLUE SHIELD | Admitting: Family Medicine

## 2018-11-06 VITALS — BP 113/68 | HR 65 | Temp 98.2°F | Resp 16 | Ht 69.75 in | Wt 202.2 lb

## 2018-11-06 DIAGNOSIS — I10 Essential (primary) hypertension: Secondary | ICD-10-CM | POA: Diagnosis not present

## 2018-11-06 DIAGNOSIS — E7800 Pure hypercholesterolemia, unspecified: Secondary | ICD-10-CM

## 2018-11-06 LAB — BASIC METABOLIC PANEL
BUN: 21 mg/dL (ref 6–23)
CALCIUM: 9 mg/dL (ref 8.4–10.5)
CO2: 31 meq/L (ref 19–32)
Chloride: 105 mEq/L (ref 96–112)
Creatinine, Ser: 1.22 mg/dL (ref 0.40–1.50)
GFR: 64.63 mL/min (ref >60.00)
Glucose, Bld: 82 mg/dL (ref 70–99)
Potassium: 4.3 mEq/L (ref 3.5–5.1)
SODIUM: 140 meq/L (ref 135–145)

## 2018-11-06 MED ORDER — LISINOPRIL 10 MG PO TABS: 10.0000 mg | ORAL_TABLET | Freq: Every day | ORAL | 1 refills | Status: DC

## 2018-11-06 NOTE — Progress Notes (Signed)
OFFICE VISIT  11/06/2018   CC:  Chief Complaint  Patient presents with  . Follow-up    HTN, pt is not fasting.    HPI:    Patient is a 57 y.o. Caucasian male who presents for 1 mo f/u HTN. Started lisinopril 5mg  qd last visit.  Increased to 10mg  qd after 2 wks or so.  Interim Hx: Tolerating med well.  Dull HA--tolerable at this time. Home bp's: 120s-130s/80-90.     HLD: he is ready to start his statin now.     Past Medical History:  Diagnosis Date  . ALLERGIC RHINITIS 06/23/2008  . Anxiety   . BENIGN PROSTATIC HYPERTROPHY, HX OF 07/08/2008  . Blood in stool    noted 12-22 in stool and toilet paper-? hemorrhoid- BRB  . Blood transfusion without reported diagnosis    3 units of his own blood   . Hypercholesterolemia 09/2018   Recommended statin 09/2018; pt elected to do TLC  . Increased prostate specific antigen (PSA) velocity 2019   10/13/18 urol f/u-->surveillance chosen over bx-->f/u urol/rpt PSA 6 mo.  . Insomnia   . LOW BACK PAIN 06/25/2010  . NEPHROLITHIASIS, HX OF 06/23/2008    Past Surgical History:  Procedure Laterality Date  . COLONOSCOPY  2011;2017   2011 adenomatous polyp.  12/29/15 hyperplastic polyps.  Recall 10 yrs.  Marland Kitchen GANGLION CYST EXCISION Left    wrist   . SPERMATOCELECTOMY      Outpatient Medications Prior to Visit  Medication Sig Dispense Refill  . ALPRAZolam (XANAX) 0.25 MG tablet TAKE 1 TABLET BY MOUTH AS NEEDED AS DIRECTED 60 tablet 2  . fluticasone (FLONASE) 50 MCG/ACT nasal spray Place 1 spray into both nostrils daily.    . propranolol (INDERAL) 10 MG tablet TAKE 1 TABLET BY MOUTH AS NEEDED 60 tablet 5  . lisinopril (PRINIVIL,ZESTRIL) 5 MG tablet Take 1 tablet (5 mg total) by mouth daily. 30 tablet 1  . atorvastatin (LIPITOR) 20 MG tablet Take 1 tablet (20 mg total) by mouth daily. (Patient not taking: Reported on 11/06/2018) 30 tablet 3   No facility-administered medications prior to visit.     Allergies  Allergen Reactions  .  Hydrocodone Nausea Only  . Codeine Nausea And Vomiting    ROS As per HPI  PE: Blood pressure 113/68, pulse 65, temperature 98.2 F (36.8 C), temperature source Oral, resp. rate 16, height 5' 9.75" (1.772 m), weight 202 lb 4 oz (91.7 kg), SpO2 96 %. Gen: Alert, well appearing.  Patient is oriented to person, place, time, and situation. AFFECT: pleasant, lucid thought and speech. CV: RRR, no m/r/g.   LUNGS: CTA bilat, nonlabored resps, good aeration in all lung fields. EXT: no clubbing or cyanosis.  no edema.    LABS:    Chemistry      Component Value Date/Time   NA 139 09/28/2018 0758   K 4.2 09/28/2018 0758   CL 104 09/28/2018 0758   CO2 32 09/28/2018 0758   BUN 18 09/28/2018 0758   CREATININE 1.22 09/28/2018 0758      Component Value Date/Time   CALCIUM 9.0 09/28/2018 0758   ALKPHOS 75 09/28/2018 0758   AST 13 09/28/2018 0758   ALT 17 09/28/2018 0758   BILITOT 0.8 09/28/2018 0758     Lab Results  Component Value Date   CHOL 186 09/28/2018   HDL 37.30 (L) 09/28/2018   LDLCALC 119 (H) 09/28/2018   TRIG 145.0 09/28/2018   CHOLHDL 5 09/28/2018    IMPRESSION  AND PLAN:  1) HTN: control is fine.  No med adjustment today.  He is continuing to work on diet, exercise, and stress reduction techniques to help b/p improve as well. Expect dull HA to gradually dissipate but if it doesn't then he'll call and we'll have to get him on different med. BMET today.  2) HLD: he'll start his statin today. Lab visit f/u 2-3 mo to recheck FLP.  An After Visit Summary was printed and given to the patient.  FOLLOW UP: Return in about 6 months (around 05/07/2019) for routine chronic illness f/u.  Also needs fasting lab appt in 2-3 mo to recheck FLP..  Signed:  Crissie Sickles, MD           11/06/2018

## 2018-12-17 DIAGNOSIS — Z79899 Other long term (current) drug therapy: Secondary | ICD-10-CM | POA: Diagnosis not present

## 2018-12-17 DIAGNOSIS — L400 Psoriasis vulgaris: Secondary | ICD-10-CM | POA: Diagnosis not present

## 2018-12-29 DIAGNOSIS — L03113 Cellulitis of right upper limb: Secondary | ICD-10-CM | POA: Diagnosis not present

## 2018-12-29 DIAGNOSIS — L400 Psoriasis vulgaris: Secondary | ICD-10-CM | POA: Diagnosis not present

## 2018-12-31 DIAGNOSIS — L400 Psoriasis vulgaris: Secondary | ICD-10-CM | POA: Diagnosis not present

## 2018-12-31 DIAGNOSIS — Z79899 Other long term (current) drug therapy: Secondary | ICD-10-CM | POA: Diagnosis not present

## 2018-12-31 DIAGNOSIS — D224 Melanocytic nevi of scalp and neck: Secondary | ICD-10-CM | POA: Diagnosis not present

## 2018-12-31 DIAGNOSIS — Z85828 Personal history of other malignant neoplasm of skin: Secondary | ICD-10-CM | POA: Diagnosis not present

## 2018-12-31 DIAGNOSIS — L718 Other rosacea: Secondary | ICD-10-CM | POA: Diagnosis not present

## 2019-01-07 ENCOUNTER — Other Ambulatory Visit: Payer: Self-pay | Admitting: Family Medicine

## 2019-01-07 NOTE — Telephone Encounter (Signed)
Pt called in and stated that he was out of town and left the rest of this med there.    Pharmacy - [facility] in [place]

## 2019-01-07 NOTE — Telephone Encounter (Signed)
Patient is no longer on Lisinopril 5 MG. He is on 10 MG tabs. TC to pharmacy. They have one refill on file for the 10 MG tab. Informed  Patient 90 day refill is available for pick-up.

## 2019-01-21 ENCOUNTER — Encounter: Payer: Self-pay | Admitting: Family Medicine

## 2019-01-21 ENCOUNTER — Ambulatory Visit: Payer: Self-pay

## 2019-01-21 ENCOUNTER — Ambulatory Visit: Payer: BLUE CROSS/BLUE SHIELD | Admitting: Family Medicine

## 2019-01-21 VITALS — BP 138/84 | HR 77 | Temp 98.4°F | Resp 16 | Ht 69.75 in | Wt 202.4 lb

## 2019-01-21 DIAGNOSIS — R69 Illness, unspecified: Secondary | ICD-10-CM

## 2019-01-21 DIAGNOSIS — R05 Cough: Secondary | ICD-10-CM

## 2019-01-21 DIAGNOSIS — J069 Acute upper respiratory infection, unspecified: Secondary | ICD-10-CM | POA: Diagnosis not present

## 2019-01-21 DIAGNOSIS — J111 Influenza due to unidentified influenza virus with other respiratory manifestations: Secondary | ICD-10-CM

## 2019-01-21 DIAGNOSIS — R059 Cough, unspecified: Secondary | ICD-10-CM

## 2019-01-21 LAB — POC INFLUENZA A&B (BINAX/QUICKVUE)
INFLUENZA A, POC: POSITIVE — AB
INFLUENZA B, POC: NEGATIVE

## 2019-01-21 MED ORDER — OSELTAMIVIR PHOSPHATE 75 MG PO CAPS: 75.0000 mg | ORAL_CAPSULE | Freq: Two times a day (BID) | ORAL | 0 refills | Status: DC

## 2019-01-21 NOTE — Telephone Encounter (Signed)
Pt calling with runny nose, fever, body aches and chills, headache, sore throat, non productive cough. Body aches and chills began yesterday. Pt denies SOB, wheezing. Pt is on low dose chemotherapy for psoriasis. Pt denies being out of the country in the last 30 days.  Care advice given and pt verbalized understanding. Appt made with PCP this morning.   Reason for Disposition . [1] Fever > 100.0 F (37.8 C) AND [2] diabetes mellitus or weak immune system (e.g., HIV positive, cancer chemo, splenectomy, organ transplant, chronic steroids)  Answer Assessment - Initial Assessment Questions 1. WORST SYMPTOM: "What is your worst symptom?" (e.g., cough, runny nose, muscle aches, headache, sore throat, fever)      Dry cough, runny nose, muscle aches, chills,headache. Sore throat 2. ONSET: "When did your flu symptoms start?"      Fever started yesterday  3. COUGH: "How bad is the cough?"       Tickle in chest  4. RESPIRATORY DISTRESS: "Describe your breathing."      No SOB,  5. FEVER: "Do you have a fever?" If so, ask: "What is your temperature, how was it measured, and when did it start?"     Pt having chills muscle aches 6. EXPOSURE: "Were you exposed to someone with influenza?"       no 7. FLU VACCINE: "Did you get a flu shot this year?"     yes 8. HIGH RISK DISEASE: "Do you any chronic medical problems?" (e.g., heart or lung disease, asthma, weak immune system, or other HIGH RISK conditions)  low dose   Chemotherapy for psoriasis that lowers immune system 9. PREGNANCY: "Is there any chance you are pregnant?" "When was your last menstrual period?"     n/a 10. OTHER SYMPTOMS: "Do you have any other symptoms?"  (e.g., runny nose, muscle aches, headache, sore throat)       Runny nose, headache, sore throat, muscle aches  Protocols used: INFLUENZA - SEASONAL-A-AH

## 2019-01-21 NOTE — Progress Notes (Signed)
OFFICE VISIT  01/21/2019   CC:  Chief Complaint  Patient presents with  . Cough    Dry cough   . Generalized Body Aches    Woke up this AM with body aches and feeling worse   . Sinusitis    sinus drainage x1 week    HPI:    Patient is a 58 y.o. Caucasian male who presents for respiratory symptoms. Onset about 1 wk ago, nasal drainage-"just a mild cold".  Abruptly felt much worse yesterday with ST and malaise, chills/aches Chills and aches last 24h.  No temp checked.  No SOB. Taking nyquil and dayquil.   No nausea or diarrhea.  Hydrating well.  +HA but no pain in face or maxillary teeth.   Past Medical History:  Diagnosis Date  . ALLERGIC RHINITIS 06/23/2008  . Anxiety   . BENIGN PROSTATIC HYPERTROPHY, HX OF 07/08/2008  . Blood in stool    noted 12-22 in stool and toilet paper-? hemorrhoid- BRB  . Blood transfusion without reported diagnosis    3 units of his own blood   . Hypercholesterolemia 09/2018   Recommended statin 09/2018; pt elected to do TLC  . Increased prostate specific antigen (PSA) velocity 2019   10/13/18 urol f/u-->surveillance chosen over bx-->f/u urol/rpt PSA 6 mo.  . Insomnia   . LOW BACK PAIN 06/25/2010  . NEPHROLITHIASIS, HX OF 06/23/2008    Past Surgical History:  Procedure Laterality Date  . COLONOSCOPY  2011;2017   2011 adenomatous polyp.  12/29/15 hyperplastic polyps.  Recall 10 yrs.  Marland Kitchen GANGLION CYST EXCISION Left    wrist   . SPERMATOCELECTOMY      Outpatient Medications Prior to Visit  Medication Sig Dispense Refill  . ALPRAZolam (XANAX) 0.25 MG tablet TAKE 1 TABLET BY MOUTH AS NEEDED AS DIRECTED 60 tablet 2  . atorvastatin (LIPITOR) 20 MG tablet Take 1 tablet (20 mg total) by mouth daily. 30 tablet 3  . fluticasone (FLONASE) 50 MCG/ACT nasal spray Place 1 spray into both nostrils daily.    . folic acid (FOLVITE) 1 MG tablet TK 1 T PO QD    . lisinopril (PRINIVIL,ZESTRIL) 10 MG tablet Take 1 tablet (10 mg total) by mouth daily. 90 tablet 1  .  methotrexate (RHEUMATREX) 2.5 MG tablet TK 3 TS ON FRIDAY IN THE MORNING AND 3 TS IN THE EVENING    . metroNIDAZOLE (METROGEL) 0.75 % gel APP TO FACE D    . ondansetron (ZOFRAN) 4 MG tablet TK 1 T PO 30 MINUTES PRIOR TO METHTREXATE    . propranolol (INDERAL) 10 MG tablet TAKE 1 TABLET BY MOUTH AS NEEDED 60 tablet 5   No facility-administered medications prior to visit.     Allergies  Allergen Reactions  . Hydrocodone Nausea Only  . Codeine Nausea And Vomiting    ROS As per HPI  PE: Blood pressure 138/84, pulse 77, temperature 98.4 F (36.9 C), temperature source Oral, resp. rate 16, height 5' 9.75" (1.772 m), weight 202 lb 6 oz (91.8 kg), SpO2 96 %. VS: noted--normal. Gen: alert, NAD, NONTOXIC APPEARING. HEENT: eyes without injection, drainage, or swelling.  Ears: EACs clear, TMs with normal light reflex and landmarks.  Nose: Clear rhinorrhea, with some dried, crusty exudate adherent to mildly injected mucosa.  No purulent d/c.  No paranasal sinus TTP.  No facial swelling.  Throat and mouth without focal lesion.  No pharyngial swelling, erythema, or exudate.   Neck: supple, no LAD.   LUNGS: CTA bilat, nonlabored  resps.   CV: RRR, no m/r/g. EXT: no c/c/e SKIN: no rash  LABS:    Chemistry      Component Value Date/Time   NA 140 11/06/2018 0818   K 4.3 11/06/2018 0818   CL 105 11/06/2018 0818   CO2 31 11/06/2018 0818   BUN 21 11/06/2018 0818   CREATININE 1.22 11/06/2018 0818      Component Value Date/Time   CALCIUM 9.0 11/06/2018 0818   ALKPHOS 75 09/28/2018 0758   AST 13 09/28/2018 0758   ALT 17 09/28/2018 0758   BILITOT 0.8 09/28/2018 0758     Influenza A/B rapid test: A positive  IMPRESSION AND PLAN:  Influenza A: tamiflu 75 mg bid (mild viral URI initial 3d of illness, then acute onset of flu-like sx's yesterday). May continue otc symptomatic meds. Rest, fluids, contact precautions.  An After Visit Summary was printed and given to the patient.  FOLLOW UP:  Return if symptoms worsen or fail to improve.  Signed:  Crissie Sickles, MD           01/21/2019

## 2019-01-22 ENCOUNTER — Other Ambulatory Visit: Payer: BLUE CROSS/BLUE SHIELD

## 2019-01-26 ENCOUNTER — Telehealth: Payer: Self-pay | Admitting: Family Medicine

## 2019-01-26 MED ORDER — ONDANSETRON HCL 4 MG PO TABS: ORAL_TABLET | ORAL | 1 refills | Status: DC

## 2019-01-26 NOTE — Telephone Encounter (Signed)
Copied from [place] [id number]. Topic: Quick Communication - Rx Refill/Question >> Jan 26, 2019  8:19 AM Margot Ables wrote: Medication: ondansetron (ZOFRAN) 4 MG tablet Has the patient contacted their pharmacy? No - pt states he discussed with Dr. Anitra Lauth at appt 2/6 and was told to call if he needed - pt has flu and nauseous as well as another condition he takes zofran for. Pt is out. Preferred Pharmacy (with phone number or street name): [id number] - [place], [place] - [address] [place] [address] [phone] (Phone) [phone] (Fax)

## 2019-01-26 NOTE — Telephone Encounter (Signed)
Pt advised and voiced understanding.   

## 2019-01-26 NOTE — Telephone Encounter (Signed)
Please advise. Thanks.  

## 2019-01-26 NOTE — Telephone Encounter (Signed)
Ondansetron eRxd.

## 2019-01-27 DIAGNOSIS — L400 Psoriasis vulgaris: Secondary | ICD-10-CM | POA: Diagnosis not present

## 2019-01-27 DIAGNOSIS — Z79899 Other long term (current) drug therapy: Secondary | ICD-10-CM | POA: Diagnosis not present

## 2019-02-01 ENCOUNTER — Ambulatory Visit: Payer: BLUE CROSS/BLUE SHIELD | Admitting: Family Medicine

## 2019-02-01 ENCOUNTER — Ambulatory Visit: Payer: Self-pay | Admitting: *Deleted

## 2019-02-01 ENCOUNTER — Encounter: Payer: Self-pay | Admitting: Family Medicine

## 2019-02-01 ENCOUNTER — Other Ambulatory Visit: Payer: Self-pay

## 2019-02-01 VITALS — BP 144/86 | HR 67 | Temp 98.2°F | Resp 14 | Ht 69.75 in | Wt 201.0 lb

## 2019-02-01 DIAGNOSIS — J029 Acute pharyngitis, unspecified: Secondary | ICD-10-CM | POA: Diagnosis not present

## 2019-02-01 DIAGNOSIS — I889 Nonspecific lymphadenitis, unspecified: Secondary | ICD-10-CM | POA: Diagnosis not present

## 2019-02-01 MED ORDER — AZITHROMYCIN 250 MG PO TABS: ORAL_TABLET | ORAL | 0 refills | Status: DC

## 2019-02-01 MED ORDER — BENZONATATE 200 MG PO CAPS: 200.0000 mg | ORAL_CAPSULE | Freq: Two times a day (BID) | ORAL | 0 refills | Status: DC | PRN

## 2019-02-01 NOTE — Progress Notes (Signed)
Todd Huff , 1961-08-09, 58 y.o., male MRN: [id number] Patient Care Team    Relationship Specialty Notifications Start End  McGowen, Adrian Blackwater, MD PCP - General Family Medicine  09/23/18   Milus Banister, MD Consulting Physician Gastroenterology  09/23/18   Cleon Gustin, MD Consulting Physician Urology  10/25/18     Chief Complaint  Patient presents with  . Cough    sore throat,  . Adenopathy     Subjective: Pt presents for an OV with complaints of dry cough, sore throat and left sided tender swollen glands of 4 days duration.  Patient is new to this provider, seen secondary to PCP not available.  Associated symptoms include patient tested positive for influenza on February 6 of this month.  He states he was started on Tamiflu.  Since that time he has improved from the influenza perspective.  However he continues to have a dry cough, more tender sore throat over the last weekend and tender left-sided lymphadenopathy.  He states the swollen gland is mildly improved this morning but still present.  Denies any fever, chills, nasal congestion, runny nose, sinus pressure/headache or ear discomfort.  He still endorses being fatigued from the flu infection.  He is on low-dose methotrexate for his psoriasis.  Pt has tried Mucinex DM to ease their symptoms.   Depression screen [facility] 2/9 09/23/2018 02/24/2015  Decreased Interest 0 0  Down, Depressed, Hopeless 0 0  PHQ - 2 Score 0 0    Allergies  Allergen Reactions  . Hydrocodone Nausea Only  . Codeine Nausea And Vomiting   Social History   Social History Narrative   Married, 2 grown sons 2 adult daughters.   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   Orig from [place], [place] since his 17s.   No tobacco.   No alcohol.   Past Medical History:  Diagnosis Date  . ALLERGIC RHINITIS 06/23/2008  . Anxiety   . BENIGN PROSTATIC HYPERTROPHY, HX OF 07/08/2008  . Blood in stool    noted 12-22 in stool and toilet paper-? hemorrhoid- BRB    . Blood transfusion without reported diagnosis    3 units of his own blood   . Hypercholesterolemia 09/2018   Recommended statin 09/2018; pt elected to do TLC  . Increased prostate specific antigen (PSA) velocity 2019   10/13/18 urol f/u-->surveillance chosen over bx-->f/u urol/rpt PSA 6 mo.  . Insomnia   . LOW BACK PAIN 06/25/2010  . NEPHROLITHIASIS, HX OF 06/23/2008   Past Surgical History:  Procedure Laterality Date  . COLONOSCOPY  2011;2017   2011 adenomatous polyp.  12/29/15 hyperplastic polyps.  Recall 10 yrs.  Marland Kitchen GANGLION CYST EXCISION Left    wrist   . SPERMATOCELECTOMY     Family History  Problem Relation Age of Onset  . Colon polyps Father   . Colitis Mother   . Diverticulosis Mother   . Colon polyps Mother   . Colon cancer Neg Hx   . Esophageal cancer Neg Hx   . Rectal cancer Neg Hx   . Stomach cancer Neg Hx    Allergies as of 02/01/2019      Reactions   Hydrocodone Nausea Only   Codeine Nausea And Vomiting      Medication List       Accurate as of February 01, 2019  3:23 PM. Always use your most recent med list.        ALPRAZolam 0.25 MG tablet Commonly known as:  [name]  TAKE 1 TABLET BY MOUTH AS NEEDED AS DIRECTED   atorvastatin 20 MG tablet Commonly known as:  LIPITOR Take 1 tablet (20 mg total) by mouth daily.   fluticasone 50 MCG/ACT nasal spray Commonly known as:  FLONASE Place 1 spray into both nostrils daily.   folic acid 1 MG tablet Commonly known as:  FOLVITE TK 1 T PO QD   lisinopril 10 MG tablet Commonly known as:  PRINIVIL,ZESTRIL Take 1 tablet (10 mg total) by mouth daily.   methotrexate 2.5 MG tablet Commonly known as:  RHEUMATREX TK 3 TS ON FRIDAY IN THE MORNING AND 3 TS IN THE EVENING   metroNIDAZOLE 0.75 % gel Commonly known as:  METROGEL APP TO FACE D   ondansetron 4 MG tablet Commonly known as:  ZOFRAN 1-2 tabs po tid prn nausea   oseltamivir 75 MG capsule Commonly known as:  TAMIFLU Take 1 capsule (75 mg total) by  mouth 2 (two) times daily.   propranolol 10 MG tablet Commonly known as:  INDERAL TAKE 1 TABLET BY MOUTH AS NEEDED       All past medical history, surgical history, allergies, family history, immunizations andmedications were updated in the EMR today and reviewed under the history and medication portions of their EMR.     ROS: Negative, with the exception of above mentioned in HPI   Objective:  BP (!) 144/86   Pulse 67   Temp 98.2 F (36.8 C) (Oral)   Resp 14   Ht 5' 9.75" (1.772 m)   Wt 201 lb (91.2 kg)   SpO2 95%   BMI 29.05 kg/m  Body mass index is 29.05 kg/m. Gen: Afebrile. No acute distress. Nontoxic in appearance, well developed, well nourished.  HENT: AT. [place]. Bilateral TM visualized with mild fullness left greater than right.  . MMM, no oral lesions. Bilateral nares Erythema and drainage left greater than right . Throat with mild erythema, no exudates.  No abscess appreciated.  Cough present. Eyes:Pupils Equal Round Reactive to light, Extraocular movements intact,  Conjunctiva without redness, discharge or icterus. Neck/lymp/endocrine: Supple, left-sided tender submandibular adenopathy. CV: RRR  Chest: CTAB, no wheeze or crackles. Good air movement, normal resp effort.  Neuro:  Normal gait. PERLA. EOMi. Alert. Oriented x3  No exam data present No results found. No results found for this or any previous visit (from the past 24 hour(s)).  Assessment/Plan: Todd Huff is a 58 y.o. male present for OV for  Lymphadenitis/Pharyngitis, unspecified etiology -signs of left-sided sinus drainage, mild cough, mild erythema with left tender submandibular lymphadenopathy.  Treated with azithromycin today.  Continue Mucinex DM.  Added Tessalon Perles for help with cough. -Follow-up with PCP in 1-2 weeks if not resolved.   Reviewed expectations re: course of current medical issues.  Discussed self-management of symptoms.  Outlined signs and symptoms indicating need for more  acute intervention.  Patient verbalized understanding and all questions were answered.  Patient received an After-Visit Summary.    No orders of the defined types were placed in this encounter.    Note is dictated utilizing voice recognition software. Although note has been proof read prior to signing, occasional typographical errors still can be missed. If any questions arise, please do not hesitate to call for verification.   electronically signed by:  Howard Pouch, DO  [place]

## 2019-02-01 NOTE — Telephone Encounter (Signed)
Pt reports non-productive cough, onset Friday. States Saturday AM lymph nodes at neck swollen and sore throat. Denies fever. Pt was Dx with Type A flu 01/21/2019. "These are all different symptoms." States cough is severe, dry. Has been taking Mucinex DM, ineffective. Same day appt made with Dr. Raoul Pitch as no availability with Dr. Anitra Lauth. Care advise given per protocol. Pt verbalizes understanding.  Reason for Disposition . SEVERE coughing spells (e.g., whooping sound after coughing, vomiting after coughing)  Answer Assessment - Initial Assessment Questions 1. ONSET: "When did the cough begin?"      Saturday 2. SEVERITY: "How bad is the cough today?"      Severe, "Every time I try to talk." 3. RESPIRATORY DISTRESS: "Describe your breathing."      no 4. FEVER: "Do you have a fever?" If so, ask: "What is your temperature, how was it measured, and when did it start?"     no 5. HEMOPTYSIS: "Are you coughing up any blood?" If so ask: "How much?" (flecks, streaks, tablespoons, etc.)     No 6. TREATMENT: "What have you done so far to treat the cough?" (e.g., meds, fluids, humidifier)     Mucinex DM 7. CARDIAC HISTORY: "Do you have any history of heart disease?" (e.g., heart attack, congestive heart failure)      no 8. LUNG HISTORY: "Do you have any history of lung disease?"  (e.g., pulmonary embolus, asthma, emphysema)     no 9. PE RISK FACTORS: "Do you have a history of blood clots?" (or: recent major surgery, recent prolonged travel, bedridden)     no 10. OTHER SYMPTOMS: "Do you have any other symptoms? (e.g., runny nose, wheezing, chest pain)     Neck glands swollen, L >R Onset Saturday am, Sore throat  Protocols used: COUGH - ACUTE NON-PRODUCTIVE-A-AH

## 2019-02-01 NOTE — Patient Instructions (Addendum)
Rest and hydrate.  Start Z-pack today  Tessalon perles for cough.  continue mucinex DM Continue flonase nasal spray.    Pharyngitis  Pharyngitis is a sore throat (pharynx). This is when there is redness, pain, and swelling in your throat. Most of the time, this condition gets better on its own. In some cases, you may need medicine. Follow these instructions at home:  Take over-the-counter and prescription medicines only as told by your doctor. ? If you were prescribed an antibiotic medicine, take it as told by your doctor. Do not stop taking the antibiotic even if you start to feel better. ? Do not give children aspirin. Aspirin has been linked to Reye syndrome.  Drink enough water and fluids to keep your pee (urine) clear or pale yellow.  Get a lot of rest.  Rinse your mouth (gargle) with a salt-water mixture 3-4 times a day or as needed. To make a salt-water mixture, completely dissolve -1 tsp of salt in 1 cup of warm water.  If your doctor approves, you may use throat lozenges or sprays to soothe your throat. Contact a doctor if:  You have large, tender lumps in your neck.  You have a rash.  You cough up green, yellow-brown, or bloody spit. Get help right away if:  You have a stiff neck.  You drool or cannot swallow liquids.  You cannot drink or take medicines without throwing up.  You have very bad pain that does not go away with medicine.  You have problems breathing, and it is not from a stuffy nose.  You have new pain and swelling in your knees, ankles, wrists, or elbows. Summary  Pharyngitis is a sore throat (pharynx). This is when there is redness, pain, and swelling in your throat.  If you were prescribed an antibiotic medicine, take it as told by your doctor. Do not stop taking the antibiotic even if you start to feel better.  Most of the time, pharyngitis gets better on its own. Sometimes, you may need medicine. This information is not intended to  replace advice given to you by your health care provider. Make sure you discuss any questions you have with your health care provider. Document Released: 05/20/2008 Document Revised: 01/07/2017 Document Reviewed: 01/07/2017 Elsevier Interactive Patient Education  2019 [employer].

## 2019-02-10 DIAGNOSIS — Z79899 Other long term (current) drug therapy: Secondary | ICD-10-CM | POA: Diagnosis not present

## 2019-02-10 DIAGNOSIS — L400 Psoriasis vulgaris: Secondary | ICD-10-CM | POA: Diagnosis not present

## 2019-02-28 ENCOUNTER — Other Ambulatory Visit: Payer: Self-pay | Admitting: Family Medicine

## 2019-03-09 DIAGNOSIS — L400 Psoriasis vulgaris: Secondary | ICD-10-CM | POA: Diagnosis not present

## 2019-03-09 DIAGNOSIS — Z79899 Other long term (current) drug therapy: Secondary | ICD-10-CM | POA: Diagnosis not present

## 2019-04-05 ENCOUNTER — Other Ambulatory Visit: Payer: Self-pay | Admitting: Family Medicine

## 2019-04-05 DIAGNOSIS — L400 Psoriasis vulgaris: Secondary | ICD-10-CM | POA: Diagnosis not present

## 2019-04-05 DIAGNOSIS — L409 Psoriasis, unspecified: Secondary | ICD-10-CM | POA: Diagnosis not present

## 2019-04-05 DIAGNOSIS — Z79899 Other long term (current) drug therapy: Secondary | ICD-10-CM | POA: Diagnosis not present

## 2019-04-09 DIAGNOSIS — R972 Elevated prostate specific antigen [PSA]: Secondary | ICD-10-CM | POA: Diagnosis not present

## 2019-04-16 DIAGNOSIS — N401 Enlarged prostate with lower urinary tract symptoms: Secondary | ICD-10-CM | POA: Diagnosis not present

## 2019-04-16 DIAGNOSIS — R972 Elevated prostate specific antigen [PSA]: Secondary | ICD-10-CM | POA: Diagnosis not present

## 2019-04-16 DIAGNOSIS — R351 Nocturia: Secondary | ICD-10-CM | POA: Diagnosis not present

## 2019-04-22 DIAGNOSIS — L400 Psoriasis vulgaris: Secondary | ICD-10-CM | POA: Diagnosis not present

## 2019-05-01 ENCOUNTER — Other Ambulatory Visit: Payer: Self-pay | Admitting: Family Medicine

## 2019-05-03 NOTE — Telephone Encounter (Signed)
LMTCB if he has enough of med to last until appt on Friday, 5/22.

## 2019-05-07 ENCOUNTER — Ambulatory Visit (INDEPENDENT_AMBULATORY_CARE_PROVIDER_SITE_OTHER): Payer: BLUE CROSS/BLUE SHIELD | Admitting: Family Medicine

## 2019-05-07 ENCOUNTER — Encounter: Payer: Self-pay | Admitting: Family Medicine

## 2019-05-07 VITALS — BP 128/88 | HR 75 | Wt 200.0 lb

## 2019-05-07 DIAGNOSIS — L409 Psoriasis, unspecified: Secondary | ICD-10-CM | POA: Diagnosis not present

## 2019-05-07 DIAGNOSIS — R972 Elevated prostate specific antigen [PSA]: Secondary | ICD-10-CM | POA: Diagnosis not present

## 2019-05-07 DIAGNOSIS — I10 Essential (primary) hypertension: Secondary | ICD-10-CM

## 2019-05-07 DIAGNOSIS — E7800 Pure hypercholesterolemia, unspecified: Secondary | ICD-10-CM

## 2019-05-07 NOTE — Progress Notes (Signed)
Virtual Visit via Video Note  I connected with pt on 05/07/19 at  8:15 AM EDT by a video enabled telemedicine application and verified that I am speaking with the correct person using two identifiers.  Location patient: home Location provider:work or home office Persons participating in the virtual visit: patient, provider  I discussed the limitations of evaluation and management by telemedicine and the availability of in person appointments. The patient expressed understanding and agreed to proceed.  Telemedicine visit is a necessity given the COVID-19 restrictions in place at the current time.  HPI: 58 y/o male being seen today for 6 mo f/u HTN, HLD, and increased PSA velocity. Reports feeling very well.  Staying active walking daily and hiking on w/e's.  Trying to eat low Na diet, more fruits/veggies, more lean meat.  HTN: home bp's normal.  Compliant with meds.    HLD: started statin 6 mo ago, has not returned for repeat lipid testing as planned. No adverse side effects felt from med.  Prostate: last visit had inc psa velocity (2.93-3.84 over 1 yr period).  He went to his urologist a week ago or so and PSA repeat was 3.11.  He was reassured and he is set to f/u with urol in 6 mo.  ROS: no CP, no SOB, no wheezing, no cough, no dizziness, no HAs, no rashes, no melena/hematochezia.  No polyuria or polydipsia.  No myalgias or arthralgias.   Past Medical History:  Diagnosis Date  . ALLERGIC RHINITIS 06/23/2008  . Anxiety   . BENIGN PROSTATIC HYPERTROPHY, HX OF 07/08/2008  . Blood in stool    noted 12-22 in stool and toilet paper-? hemorrhoid- BRB  . Blood transfusion without reported diagnosis    3 units of his own blood   . Hypercholesterolemia 09/2018   Recommended statin 09/2018; pt elected to do TLC  . Increased prostate specific antigen (PSA) velocity 2019   10/13/18 urol f/u-->surveillance chosen over bx-->f/u urol/rpt PSA 6 mo.  . Insomnia   . LOW BACK PAIN 06/25/2010  .  NEPHROLITHIASIS, HX OF 06/23/2008    Past Surgical History:  Procedure Laterality Date  . COLONOSCOPY  2011;2017   2011 adenomatous polyp.  12/29/15 hyperplastic polyps.  Recall 10 yrs.  Marland Kitchen GANGLION CYST EXCISION Left    wrist   . SPERMATOCELECTOMY      Family History  Problem Relation Age of Onset  . Colon polyps Father   . Colitis Mother   . Diverticulosis Mother   . Colon polyps Mother   . Colon cancer Neg Hx   . Esophageal cancer Neg Hx   . Rectal cancer Neg Hx   . 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   Current Outpatient Medications:  .  ALPRAZolam (XANAX) 0.25 MG tablet, TAKE 1 TABLET BY MOUTH AS NEEDED AS DIRECTED, Disp: 60 tablet, Rfl: 2 .  atorvastatin (LIPITOR) 20 MG tablet, TAKE 1 TABLET BY MOUTH EVERY DAY, Disp: 30 tablet, Rfl: 1 .  azithromycin (ZITHROMAX) 250 MG tablet, 500 mg day 1, 250 mg QD, Disp: 6 tablet, Rfl: 0 .  benzonatate (TESSALON) 200 MG capsule, Take 1 capsule (200 mg total) by mouth 2 (two) times daily as needed for cough., Disp: 20 capsule, Rfl: 0 .  fluticasone (FLONASE) 50 MCG/ACT nasal spray, Place 1 spray into both nostrils daily., Disp: , Rfl:  .  folic acid (FOLVITE) 1 MG tablet, TK 1 T PO QD, Disp: ,  Rfl:  .  lisinopril (ZESTRIL) 10 MG tablet, TAKE 1 TABLET BY MOUTH DAILY, Disp: 90 tablet, Rfl: 1 .  methotrexate (RHEUMATREX) 2.5 MG tablet, TK 3 TS ON FRIDAY IN THE MORNING AND 3 TS IN THE EVENING, Disp: , Rfl:  .  metroNIDAZOLE (METROGEL) 0.75 % gel, APP TO FACE D, Disp: , Rfl:  .  ondansetron (ZOFRAN) 4 MG tablet, 1-2 tabs po tid prn nausea, Disp: 30 tablet, Rfl: 1 .  propranolol (INDERAL) 10 MG tablet, TAKE 1 TABLET BY MOUTH AS NEEDED, Disp: 60 tablet, Rfl: 5  EXAM:  VITALS per patient if applicable: BP 071/21 (BP Location: Left Arm, Patient Position: Sitting, Cuff Size: Large)   Pulse 75   Wt 200 lb (90.7 kg)   BMI 28.90 kg/m    GENERAL: alert, oriented, appears well and in  no acute distress  HEENT: atraumatic, conjunttiva clear, no obvious abnormalities on inspection of external nose and ears  NECK: normal movements of the head and neck  LUNGS: on inspection no signs of respiratory distress, breathing rate appears normal, no obvious gross SOB, gasping or wheezing  CV: no obvious cyanosis  MS: moves all visible extremities without noticeable abnormality  PSYCH/NEURO: pleasant and cooperative, no obvious depression or anxiety, speech and thought processing grossly intact  LABS: none today    Chemistry      Component Value Date/Time   NA 140 11/06/2018 0818   K 4.3 11/06/2018 0818   CL 105 11/06/2018 0818   CO2 31 11/06/2018 0818   BUN 21 11/06/2018 0818   CREATININE 1.22 11/06/2018 0818      Component Value Date/Time   CALCIUM 9.0 11/06/2018 0818   ALKPHOS 75 09/28/2018 0758   AST 13 09/28/2018 0758   ALT 17 09/28/2018 0758   BILITOT 0.8 09/28/2018 0758     Lab Results  Component Value Date   CHOL 186 09/28/2018   HDL 37.30 (L) 09/28/2018   LDLCALC 119 (H) 09/28/2018   TRIG 145.0 09/28/2018   CHOLHDL 5 09/28/2018   Lab Results  Component Value Date   PSA 3.84 09/28/2018   PSA 2.93 09/08/2017   PSA 2.74 05/20/2016    ASSESSMENT AND PLAN:  Discussed the following assessment and plan:  1) HTN: The current medical regimen is effective;  continue present plan and medications. Lytes/cr future.  2) HLD: tolerating statin x 6 mo now.   FLP and hepatic panel--future.  3) Increased PSA velocity: he actually followed up with his urologist and repeat PSA was a little better. This is reassuring.  He has urol f/u 6 mo.  4) Psoriasis on both hands: derm -->methotrexate used b/c topicals no help.  Got got great response to methotrexate but got lots of resp infections on this med so he weened off of it recently and has switched to UV light therapy and is doing very well. Pt says all lab testing at Derm while on methotrexate was fine except a  brief up tick in LFTs during a case of flu when he was also taking lots of ibuprofen.  He was taken off the methotrexate a few weeks as a result and things returned to normal. Hepatic panel--future.  Pt to f/u with Dr. Ronnald Ramp in [place].   I discussed the assessment and treatment plan with the patient. The patient was provided an opportunity to ask questions and all were answered. The patient agreed with the plan and demonstrated an understanding of the instructions.   The patient was advised to  call back or seek an in-person evaluation if the symptoms worsen or if the condition fails to improve as anticipated.  F/u: 6 mo CPE  Signed:  Crissie Sickles, MD           05/07/2019

## 2019-05-13 ENCOUNTER — Telehealth: Payer: Self-pay | Admitting: Family Medicine

## 2019-05-13 MED ORDER — ALPRAZOLAM 0.25 MG PO TABS: ORAL_TABLET | ORAL | 2 refills | Status: DC

## 2019-05-13 NOTE — Telephone Encounter (Signed)
RF request for xanax  LOV: 05/07/2019 Next ov: Not schceduled  Last written: 09/08/2017 Dr Burnice Logan   Please advise

## 2019-05-13 NOTE — Telephone Encounter (Unsigned)
Copied from [place] [id number]. Topic: Quick Communication - Rx Refill/Question >> May 13, 2019  9:20 AM [name] wrote: Medication: ALPRAZolam [name]) 0.25 MG tablet  Has the patient contacted their pharmacy? yes - call PCP  Preferred Pharmacy (with phone number or street name): [facility] [place], [place] - [address] [place] [address] [phone] (Phone) [phone] (Fax)    Agent: Please be advised that RX refills may take up to 3 business days. We ask that you follow-up with your pharmacy.

## 2019-05-13 NOTE — Telephone Encounter (Signed)
Alpraz eRx'd 

## 2019-06-24 ENCOUNTER — Telehealth: Payer: Self-pay | Admitting: Family Medicine

## 2019-06-24 DIAGNOSIS — L400 Psoriasis vulgaris: Secondary | ICD-10-CM | POA: Diagnosis not present

## 2019-06-24 NOTE — Telephone Encounter (Signed)
Medication Refill - Medication: ALPRAZolam (XANAX) 0.25 MG tablet   Has the patient contacted their pharmacy? Yes.  Pt states he called to have this medication refilled a month ago and was never able to get it refilled. Please advise.  (Agent: If no, request that the patient contact the pharmacy for the refill.) (Agent: If yes, when and what did the pharmacy advise?)  Preferred Pharmacy (with phone number or street name):  [id number] - [place], [place] - [address] [place] [address]  [address] [place] [address]  Phone: [phone] Fax: [phone]  Not a 24 hour pharmacy; exact hours not known.     Agent: Please be advised that RX refills may take up to 3 business days. We ask that you follow-up with your pharmacy.

## 2019-06-24 NOTE — Telephone Encounter (Signed)
Forward to [facility] clinical team

## 2019-06-25 NOTE — Telephone Encounter (Signed)
RF request for Alprazolam LOV: 05/07/19 Next ov: advised to f/u 6 months CPE Last written: 05/13/19 (60,2) No CSC or UDS. No record of you prescribing this on PMP aware.  Please advise, thanks.

## 2019-06-30 NOTE — Telephone Encounter (Signed)
Patient would like a call to find out why xanax was not called in yet? Says Dr. Anitra Lauth has been filling this for him for years. Please advise.

## 2019-07-01 MED ORDER — ALPRAZOLAM 0.25 MG PO TABS: ORAL_TABLET | ORAL | 5 refills | Status: DC

## 2019-07-01 NOTE — Telephone Encounter (Signed)
My apologies. Alprazolam erx'd today.

## 2019-07-01 NOTE — Telephone Encounter (Signed)
Patient was notified.

## 2019-09-15 ENCOUNTER — Other Ambulatory Visit: Payer: Self-pay

## 2019-09-15 MED ORDER — ATORVASTATIN CALCIUM 20 MG PO TABS: 20.0000 mg | ORAL_TABLET | Freq: Every day | ORAL | 0 refills | Status: DC

## 2019-09-15 MED ORDER — LISINOPRIL 10 MG PO TABS: 10.0000 mg | ORAL_TABLET | Freq: Every day | ORAL | 0 refills | Status: DC

## 2019-10-06 ENCOUNTER — Other Ambulatory Visit: Payer: Self-pay

## 2019-10-06 ENCOUNTER — Encounter: Payer: Self-pay | Admitting: Family Medicine

## 2019-10-06 ENCOUNTER — Ambulatory Visit (INDEPENDENT_AMBULATORY_CARE_PROVIDER_SITE_OTHER): Payer: BC Managed Care – PPO | Admitting: Family Medicine

## 2019-10-06 VITALS — BP 143/84 | HR 76 | Temp 98.5°F | Resp 16 | Ht 69.5 in | Wt 202.6 lb

## 2019-10-06 DIAGNOSIS — I10 Essential (primary) hypertension: Secondary | ICD-10-CM | POA: Diagnosis not present

## 2019-10-06 DIAGNOSIS — Z0000 Encounter for general adult medical examination without abnormal findings: Secondary | ICD-10-CM | POA: Diagnosis not present

## 2019-10-06 DIAGNOSIS — E7800 Pure hypercholesterolemia, unspecified: Secondary | ICD-10-CM

## 2019-10-06 NOTE — Progress Notes (Signed)
Office Note 10/06/2019  CC:  Chief Complaint  Patient presents with  . Annual Exam    pt is not fasting   HPI:  Todd Huff is a 58 y.o. male who is here for annual health maintenance exam.  Home bp monitoring: typically low 130s over low 80s.  He has anxiety for which he uses xanax 0.25mg  bid prn ONLY for public speaking anxiety.  Uses this approx once per month.  Reviewed PMP AWARE today: last filled->07/01/19, #60, no RF, rx by me.  No acute complaints.  Past Medical History:  Diagnosis Date  . ALLERGIC RHINITIS 06/23/2008  . Anxiety   . BENIGN PROSTATIC HYPERTROPHY, HX OF 07/08/2008  . Blood in stool    noted 12-22 in stool and toilet paper-? hemorrhoid- BRB  . Blood transfusion without reported diagnosis    3 units of his own blood   . Hypercholesterolemia 09/2018   Recommended statin 09/2018; pt elected to do TLC  . Increased prostate specific antigen (PSA) velocity 2019   10/13/18 urol f/u-->surveillance chosen over bx-->f/u urol/rpt PSA 6 mo.  . Insomnia   . LOW BACK PAIN 06/25/2010  . NEPHROLITHIASIS, HX OF 06/23/2008  . Psoriasis   . Vitiligo     Past Surgical History:  Procedure Laterality Date  . COLONOSCOPY  2011;2017   2011 adenomatous polyp.  12/29/15 hyperplastic polyps.  Recall 10 yrs.  Marland Kitchen GANGLION CYST EXCISION Left    wrist   . SPERMATOCELECTOMY      Family History  Problem Relation Age of Onset  . Colon polyps Father   . Colitis Mother   . Diverticulosis Mother   . Colon polyps Mother   . Colon cancer Neg Hx   . Esophageal cancer Neg Hx   . Rectal cancer Neg Hx   . Stomach cancer Neg Hx     Social History   Socioeconomic History  . Marital status: Married    Spouse name: Not on file  . Number of children: Not on file  . Years of education: Not on file  . Highest education level: Not on file  Occupational History  . Not on file  Social Needs  . Financial resource strain: Not on file  . Food insecurity    Worry: Not on file   Inability: Not on file  . Transportation needs    Medical: Not on file    Non-medical: Not on file  Tobacco Use  . Smoking status: Never Smoker  . Smokeless tobacco: Never Used  Substance and Sexual Activity  . Alcohol use: No    Alcohol/week: 0.0 standard drinks  . Drug use: No  . Sexual activity: Not on file  Lifestyle  . Physical activity    Days per week: Not on file    Minutes per session: Not on file  . Stress: Not on file  Relationships  . Social Herbalist on phone: Not on file    Gets together: Not on file    Attends religious service: Not on file    Active member of club or organization: Not on file    Attends meetings of clubs or organizations: Not on file    Relationship status: Not on file  . Intimate partner violence    Fear of current or ex partner: Not on file    Emotionally abused: Not on file    Physically abused: Not on file    Forced sexual activity: Not on file  Other Topics Concern  .  Not on file  Social History Narrative   Married, 2 grown sons 2 adult daughters.   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   No tobacco.   No alcohol.    Outpatient Medications Prior to Visit  Medication Sig Dispense Refill  . ALPRAZolam (XANAX) 0.25 MG tablet TAKE 1 TABLET BY MOUTH AS NEEDED AS DIRECTED 60 tablet 5  . atorvastatin (LIPITOR) 20 MG tablet Take 1 tablet (20 mg total) by mouth daily. 30 tablet 0  . fluticasone (FLONASE) 50 MCG/ACT nasal spray Place 1 spray into both nostrils daily.    Marland Kitchen lisinopril (ZESTRIL) 10 MG tablet Take 1 tablet (10 mg total) by mouth daily. 30 tablet 0  . metroNIDAZOLE (METROGEL) 0.75 % gel as needed.     . propranolol (INDERAL) 10 MG tablet TAKE 1 TABLET BY MOUTH AS NEEDED 60 tablet 5  . folic acid (FOLVITE) 1 MG tablet TK 1 T PO QD    . methotrexate (RHEUMATREX) 2.5 MG tablet TK 3 TS ON FRIDAY IN THE MORNING AND 3 TS IN THE EVENING    . ondansetron (ZOFRAN) 4 MG tablet 1-2  tabs po tid prn nausea (Patient not taking: Reported on 05/07/2019) 30 tablet 1   No facility-administered medications prior to visit.     Allergies  Allergen Reactions  . Hydrocodone Nausea Only  . Codeine Nausea And Vomiting    ROS Review of Systems  Constitutional: Negative for appetite change, chills, fatigue and fever.  HENT: Negative for congestion, dental problem, ear pain and sore throat.   Eyes: Negative for discharge, redness and visual disturbance.  Respiratory: Negative for cough, chest tightness, shortness of breath and wheezing.   Cardiovascular: Negative for chest pain, palpitations and leg swelling.  Gastrointestinal: Negative for abdominal pain, blood in stool, diarrhea, nausea and vomiting.  Genitourinary: Negative for difficulty urinating, dysuria, flank pain, frequency, hematuria and urgency.  Musculoskeletal: Negative for arthralgias, back pain, joint swelling, myalgias and neck stiffness.  Skin: Negative for pallor and rash.  Neurological: Negative for dizziness, speech difficulty, weakness and headaches.  Hematological: Negative for adenopathy. Does not bruise/bleed easily.  Psychiatric/Behavioral: Negative for confusion and sleep disturbance. The patient is not nervous/anxious.     PE; Blood pressure (!) 143/84, pulse 76, temperature 98.5 F (36.9 C), temperature source Temporal, resp. rate 16, height 5' 9.5" (1.765 m), weight 202 lb 9.6 oz (91.9 kg), SpO2 94 %. Body mass index is 29.49 kg/m.  Gen: Alert, well appearing.  Patient is oriented to person, place, time, and situation. AFFECT: pleasant, lucid thought and speech. ENT: Ears: EACs clear, normal epithelium.  TMs with good light reflex and landmarks bilaterally.  Eyes: no injection, icteris, swelling, or exudate.  EOMI, PERRLA. Nose: no drainage or turbinate edema/swelling.  No injection or focal lesion.  Mouth: lips without lesion/swelling.  Oral mucosa pink and moist.  Dentition intact and without  obvious caries or gingival swelling.  Oropharynx without erythema, exudate, or swelling.  Neck: supple/nontender.  No LAD, mass, or TM.  Carotid pulses 2+ bilaterally, without bruits. CV: RRR, no m/r/g.   LUNGS: CTA bilat, nonlabored resps, good aeration in all lung fields. ABD: soft, NT, ND, BS normal.  No hepatospenomegaly or mass.  No bruits. EXT: no clubbing, cyanosis, or edema.  Musculoskeletal: no joint swelling, erythema, warmth, or tenderness.  ROM of all joints intact. Skin - no sores or suspicious lesions or rashes. Has some scattered large patches of  mild hyperpigmentation c/w vitiligo.  Pertinent labs:  Lab Results  Component Value Date   TSH 1.99 09/28/2018   Lab Results  Component Value Date   WBC 4.5 09/28/2018   HGB 16.1 09/28/2018   HCT 47.7 09/28/2018   MCV 88.0 09/28/2018   PLT 165.0 09/28/2018   Lab Results  Component Value Date   CREATININE 1.22 11/06/2018   BUN 21 11/06/2018   NA 140 11/06/2018   K 4.3 11/06/2018   CL 105 11/06/2018   CO2 31 11/06/2018   Lab Results  Component Value Date   ALT 17 09/28/2018   AST 13 09/28/2018   ALKPHOS 75 09/28/2018   BILITOT 0.8 09/28/2018   Lab Results  Component Value Date   CHOL 186 09/28/2018   Lab Results  Component Value Date   HDL 37.30 (L) 09/28/2018   Lab Results  Component Value Date   LDLCALC 119 (H) 09/28/2018   Lab Results  Component Value Date   TRIG 145.0 09/28/2018   Lab Results  Component Value Date   CHOLHDL 5 09/28/2018   Lab Results  Component Value Date   PSA 3.84 09/28/2018   PSA 2.93 09/08/2017   PSA 2.74 05/20/2016    ASSESSMENT AND PLAN:   Health maintenance exam: Reviewed age and gender appropriate health maintenance issues (prudent diet, regular exercise, health risks of tobacco and excessive alcohol, use of seatbelts, fire alarms in home, use of sunscreen).  Also reviewed age and gender appropriate health screening as well as vaccine recommendations. Vaccines:  Tdap->he'll wait.  Flu vaccine->has already had it this season.  Shingrix->he'll wait. Labs: fasting HP ordered for tomorrow morning. Prostate ca screening: hx of increased PSA velocity, now followed by urologist.  Has lab tomorrow an appt next week. Colon ca screening: next colonoscopy due 2027.   HTN: The current medical regimen is effective;  continue present plan and medications.  An After Visit Summary was printed and given to the patient.  FOLLOW UP:  Return in about 6 months (around 04/05/2020) for routine chronic illness f/u.  Signed:  Crissie Sickles, MD           10/06/2019

## 2019-10-06 NOTE — Patient Instructions (Signed)

## 2019-10-07 ENCOUNTER — Ambulatory Visit (INDEPENDENT_AMBULATORY_CARE_PROVIDER_SITE_OTHER): Payer: BC Managed Care – PPO | Admitting: Family Medicine

## 2019-10-07 ENCOUNTER — Telehealth: Payer: Self-pay

## 2019-10-07 ENCOUNTER — Encounter: Payer: Self-pay | Admitting: Family Medicine

## 2019-10-07 DIAGNOSIS — I10 Essential (primary) hypertension: Secondary | ICD-10-CM | POA: Diagnosis not present

## 2019-10-07 DIAGNOSIS — R351 Nocturia: Secondary | ICD-10-CM | POA: Diagnosis not present

## 2019-10-07 DIAGNOSIS — E7800 Pure hypercholesterolemia, unspecified: Secondary | ICD-10-CM | POA: Diagnosis not present

## 2019-10-07 DIAGNOSIS — N401 Enlarged prostate with lower urinary tract symptoms: Secondary | ICD-10-CM | POA: Diagnosis not present

## 2019-10-07 LAB — COMPREHENSIVE METABOLIC PANEL
ALT: 22 U/L (ref 0–53)
AST: 19 U/L (ref 0–37)
Albumin: 4.4 g/dL (ref 3.5–5.2)
Alkaline Phosphatase: 75 U/L (ref 39–117)
BUN: 19 mg/dL (ref 6–23)
CO2: 30 mEq/L (ref 19–32)
Calcium: 9.1 mg/dL (ref 8.4–10.5)
Chloride: 104 mEq/L (ref 96–112)
Creatinine, Ser: 1.25 mg/dL (ref 0.40–1.50)
GFR: 58.94 mL/min — ABNORMAL LOW (ref >60.00)
Glucose, Bld: 101 mg/dL — ABNORMAL HIGH (ref 70–99)
Potassium: 4.2 mEq/L (ref 3.5–5.1)
Sodium: 140 mEq/L (ref 135–145)
Total Bilirubin: 1 mg/dL (ref 0.2–1.2)
Total Protein: 6.4 g/dL (ref 6.0–8.3)

## 2019-10-07 LAB — CBC WITH DIFFERENTIAL/PLATELET
Basophils Absolute: 0 10*3/uL (ref 0.0–0.1)
Basophils Relative: 0.9 % (ref 0.0–3.0)
Eosinophils Absolute: 0.1 10*3/uL (ref 0.0–0.7)
Eosinophils Relative: 1.9 % (ref 0.0–5.0)
HCT: 47.3 % (ref 39.0–52.0)
Hemoglobin: 16 g/dL (ref 13.0–17.0)
Lymphocytes Relative: 29.9 % (ref 12.0–46.0)
Lymphs Abs: 1.6 10*3/uL (ref 0.7–4.0)
MCHC: 33.8 g/dL (ref 30.0–36.0)
MCV: 89 fl (ref 78.0–100.0)
Monocytes Absolute: 0.4 10*3/uL (ref 0.1–1.0)
Monocytes Relative: 7.8 % (ref 3.0–12.0)
Neutro Abs: 3.1 10*3/uL (ref 1.4–7.7)
Neutrophils Relative %: 59.5 % (ref 43.0–77.0)
Platelets: 160 10*3/uL (ref 150.0–400.0)
RBC: 5.31 Mil/uL (ref 4.22–5.81)
RDW: 13.3 % (ref 11.5–15.5)
WBC: 5.3 10*3/uL (ref 4.0–10.5)

## 2019-10-07 LAB — LIPID PANEL
Cholesterol: 135 mg/dL (ref 0–200)
HDL: 41.3 mg/dL (ref >39.00)
LDL Cholesterol: 73 mg/dL (ref 0–99)
NonHDL: 93.46
Total CHOL/HDL Ratio: 3
Triglycerides: 100 mg/dL (ref 0.0–149.0)
VLDL: 20 mg/dL (ref 0.0–40.0)

## 2019-10-07 LAB — TSH: TSH: 1.41 u[IU]/mL (ref 0.35–4.50)

## 2019-10-07 NOTE — Telephone Encounter (Signed)
Patient brought in health form to be completed. Lab results are back. Form has been completed. Pt will need to fill in blanks and copy will be made for chart.

## 2019-10-12 DIAGNOSIS — R972 Elevated prostate specific antigen [PSA]: Secondary | ICD-10-CM | POA: Diagnosis not present

## 2019-10-13 ENCOUNTER — Telehealth: Payer: Self-pay | Admitting: Family Medicine

## 2019-10-13 NOTE — Telephone Encounter (Signed)
Patient states the work health form he has is marked that he is not eligible for cholesterol. Patient states his labs were normal. May be get an updated form?

## 2019-10-13 NOTE — Telephone Encounter (Signed)
Spoke with patient regarding form. He will email the form to me and picking up by 5 this afternoon.

## 2019-10-14 ENCOUNTER — Other Ambulatory Visit: Payer: Self-pay

## 2019-10-14 ENCOUNTER — Encounter: Payer: BLUE CROSS/BLUE SHIELD | Admitting: Family Medicine

## 2019-10-14 MED ORDER — ATORVASTATIN CALCIUM 20 MG PO TABS: 20.0000 mg | ORAL_TABLET | Freq: Every day | ORAL | 1 refills | Status: DC

## 2019-10-14 MED ORDER — LISINOPRIL 10 MG PO TABS: 10.0000 mg | ORAL_TABLET | Freq: Every day | ORAL | 1 refills | Status: DC

## 2019-10-14 NOTE — Telephone Encounter (Signed)
Form was scanned to pt's email yesterday afternoon.

## 2020-02-18 ENCOUNTER — Other Ambulatory Visit: Payer: Self-pay

## 2020-02-18 MED ORDER — PROPRANOLOL HCL 10 MG PO TABS: 10.0000 mg | ORAL_TABLET | ORAL | 5 refills | Status: DC | PRN

## 2020-02-18 NOTE — Telephone Encounter (Signed)
Patient request Rx propranolol (INDERAL) 10 MG tablet sent to [facility]

## 2020-02-18 NOTE — Telephone Encounter (Signed)
Patient uses this as needed. It has not been prescribed since 02/29/16 (60,5) by [facility]. Pt's last visit with you was 10/06/19 for CPE. Pharmacy verified. Medication pending.  Please advise, thanks.

## 2020-03-16 DIAGNOSIS — I493 Ventricular premature depolarization: Secondary | ICD-10-CM

## 2020-03-16 DIAGNOSIS — R002 Palpitations: Secondary | ICD-10-CM

## 2020-03-16 HISTORY — DX: Ventricular premature depolarization: I49.3

## 2020-03-16 HISTORY — DX: Palpitations: R00.2

## 2020-04-03 ENCOUNTER — Other Ambulatory Visit: Payer: Self-pay

## 2020-04-04 ENCOUNTER — Other Ambulatory Visit: Payer: Self-pay

## 2020-04-04 ENCOUNTER — Encounter: Payer: Self-pay | Admitting: Family Medicine

## 2020-04-04 ENCOUNTER — Ambulatory Visit (INDEPENDENT_AMBULATORY_CARE_PROVIDER_SITE_OTHER): Payer: BC Managed Care – PPO | Admitting: Family Medicine

## 2020-04-04 VITALS — BP 123/78 | HR 68 | Temp 98.3°F | Resp 16 | Ht 69.5 in | Wt 209.8 lb

## 2020-04-04 DIAGNOSIS — I493 Ventricular premature depolarization: Secondary | ICD-10-CM

## 2020-04-04 DIAGNOSIS — Z79899 Other long term (current) drug therapy: Secondary | ICD-10-CM | POA: Diagnosis not present

## 2020-04-04 DIAGNOSIS — E7800 Pure hypercholesterolemia, unspecified: Secondary | ICD-10-CM

## 2020-04-04 DIAGNOSIS — F418 Other specified anxiety disorders: Secondary | ICD-10-CM

## 2020-04-04 DIAGNOSIS — I499 Cardiac arrhythmia, unspecified: Secondary | ICD-10-CM | POA: Diagnosis not present

## 2020-04-04 DIAGNOSIS — R9431 Abnormal electrocardiogram [ECG] [EKG]: Secondary | ICD-10-CM

## 2020-04-04 DIAGNOSIS — I10 Essential (primary) hypertension: Secondary | ICD-10-CM | POA: Diagnosis not present

## 2020-04-04 NOTE — Progress Notes (Signed)
OFFICE VISIT  04/04/2020   CC:  Chief Complaint  Patient presents with  . Follow-up    RCI, pt is not fasting   HPI:    Patient is a 59 y.o.  male who presents for 6 mo f/u HTN, HLD, and situational anxiety.  HTN: occ bp monitoring at home normal.  Compliant with meds.  HLD: tolerating statin well.  Wt 209 is about 10 lbs higher than his baseline from over the last 10 yrs or so. Walks regularly for his exercise.  He has anxiety for which he uses xanax 0.25mg  bid prn ONLY for public speaking anxiety.  Uses this approx once per month. PMP AWARE reviewed today: most recent rx for alpraz 0.25mg  was filled 07/01/19, # 25, rx by me. No red flags.  Hx of elevated PSA velocity: most recent PSA stable.  Next f/u to be arranged for anytime now. He is no longer on methotrexate for his hand psoriasis.  Uses UV light treatment at home that he uses qod.  ROS: no fevers, no CP, no SOB, no wheezing, no cough, no dizziness, no HAs, no rashes, no melena/hematochezia.  No polyuria or polydipsia.  No myalgias or arthralgias.  No focal weakness, paresthesias, or tremors.  No acute vision or hearing abnormalities. No n/v/d or abd pain.  No palpitations.    Past Medical History:  Diagnosis Date  . ALLERGIC RHINITIS 06/23/2008  . Anxiety   . BENIGN PROSTATIC HYPERTROPHY, HX OF 07/08/2008  . Blood in stool    noted 12-22 in stool and toilet paper-? hemorrhoid- BRB  . Blood transfusion without reported diagnosis    3 units of his own blood   . Hypercholesterolemia 09/2018   Recommended statin 09/2018; pt elected to do TLC  . Hypertension   . Increased prostate specific antigen (PSA) velocity 2019   10/13/18 urol f/u-->surveillance chosen over bx-->f/u urol/rpt PSA 6 mo.  . Insomnia   . LOW BACK PAIN 06/25/2010  . NEPHROLITHIASIS, HX OF 06/23/2008  . Psoriasis   . Vitiligo     Past Surgical History:  Procedure Laterality Date  . COLONOSCOPY  2011;2017   2011 adenomatous polyp.  12/29/15  hyperplastic polyps.  Recall 10 yrs.  Marland Kitchen GANGLION CYST EXCISION Left    wrist   . SPERMATOCELECTOMY      Outpatient Medications Prior to Visit  Medication Sig Dispense Refill  . atorvastatin (LIPITOR) 20 MG tablet Take 1 tablet (20 mg total) by mouth daily. 90 tablet 1  . lisinopril (ZESTRIL) 10 MG tablet Take 1 tablet (10 mg total) by mouth daily. 90 tablet 1  . ALPRAZolam (XANAX) 0.25 MG tablet TAKE 1 TABLET BY MOUTH AS NEEDED AS DIRECTED (Patient not taking: Reported on 04/04/2020) 60 tablet 5  . ondansetron (ZOFRAN) 4 MG tablet 1-2 tabs po tid prn nausea (Patient not taking: Reported on 05/07/2019) 30 tablet 1  . propranolol (INDERAL) 10 MG tablet Take 1 tablet (10 mg total) by mouth as needed. 1 tab po bid prn (Patient not taking: Reported on 04/04/2020) 60 tablet 5  . fluticasone (FLONASE) 50 MCG/ACT nasal spray Place 1 spray into both nostrils daily.    . folic acid (FOLVITE) 1 MG tablet TK 1 T PO QD    . methotrexate (RHEUMATREX) 2.5 MG tablet TK 3 TS ON FRIDAY IN THE MORNING AND 3 TS IN THE EVENING    . metroNIDAZOLE (METROGEL) 0.75 % gel as needed.      No facility-administered medications prior to visit.  Allergies  Allergen Reactions  . Hydrocodone Nausea Only  . Codeine Nausea And Vomiting    ROS As per HPI  PE: Blood pressure 123/78, pulse 68, temperature 98.3 F (36.8 C), temperature source Temporal, resp. rate 16, height 5' 9.5" (1.765 m), weight 209 lb 12.8 oz (95.2 kg), SpO2 96 %. Gen: Alert, well appearing.  Patient is oriented to person, place, time, and situation. AFFECT: pleasant, lucid thought and speech. CV: Regularly irreg: every 4th beat is premature and followed by a brief pause.  No murmur, rub, or gallop. Chest is clear, no wheezing or rales. Normal symmetric air entry throughout both lung fields. No chest wall deformities or tenderness. EXT: no clubbing or cyanosis.  no edema.    LABS:  Lab Results  Component Value Date   TSH 1.41 10/07/2019    Lab Results  Component Value Date   WBC 5.3 10/07/2019   HGB 16.0 10/07/2019   HCT 47.3 10/07/2019   MCV 89.0 10/07/2019   PLT 160.0 10/07/2019   Lab Results  Component Value Date   CREATININE 1.25 10/07/2019   BUN 19 10/07/2019   NA 140 10/07/2019   K 4.2 10/07/2019   CL 104 10/07/2019   CO2 30 10/07/2019   Lab Results  Component Value Date   ALT 22 10/07/2019   AST 19 10/07/2019   ALKPHOS 75 10/07/2019   BILITOT 1.0 10/07/2019   Lab Results  Component Value Date   CHOL 135 10/07/2019   Lab Results  Component Value Date   HDL 41.30 10/07/2019   Lab Results  Component Value Date   LDLCALC 73 10/07/2019   Lab Results  Component Value Date   TRIG 100.0 10/07/2019   Lab Results  Component Value Date   CHOLHDL 3 10/07/2019   Lab Results  Component Value Date   PSA 3.84 09/28/2018   PSA 2.93 09/08/2017   PSA 2.74 05/20/2016   12 lead EKG today: NSR, rate 71, PR interval 182 msec, frequent PVCs, no heart block, no hypertrophy. No ischemic changes.  TWI V1 and III, LAE. (No prior EKG for comparison).  IMPRESSION AND PLAN:  1) Regularly irreg rhythm on auscultation.  EKG with frequent PVCs. Pt asymptomatic.   Will ask cardiology to see to make sure nothing further needs to be done at this time. Lytes/cr today.  2) HTN: The current medical regimen is effective;  continue present plan and medications. Lytes/cr today.  3) HLD: tolerating statin.  LDL good 6 mo ago. FLP and hepatic panel today.  An After Visit Summary was printed and given to the patient.  FOLLOW UP: Return in about 6 months (around 10/04/2020) for annual CPE (fasting).  Signed:  Crissie Sickles, MD           04/04/2020

## 2020-04-05 ENCOUNTER — Encounter: Payer: Self-pay | Admitting: Family Medicine

## 2020-04-05 LAB — COMPREHENSIVE METABOLIC PANEL
AG Ratio: 1.8 (calc) (ref 1.0–2.5)
ALT: 22 U/L (ref 9–46)
AST: 16 U/L (ref 10–35)
Albumin: 4.3 g/dL (ref 3.6–5.1)
Alkaline phosphatase (APISO): 81 U/L (ref 35–144)
BUN/Creatinine Ratio: 17 (calc) (ref 6–22)
BUN: 22 mg/dL (ref 7–25)
CO2: 29 mmol/L (ref 20–32)
Calcium: 9.4 mg/dL (ref 8.6–10.3)
Chloride: 104 mmol/L (ref 98–110)
Creat: 1.31 mg/dL — ABNORMAL HIGH (ref 0.70–1.25)
Globulin: 2.4 g/dL (calc) (ref 1.9–3.7)
Glucose, Bld: 93 mg/dL (ref 65–99)
Potassium: 4.2 mmol/L (ref 3.5–5.3)
Sodium: 140 mmol/L (ref 135–146)
Total Bilirubin: 0.9 mg/dL (ref 0.2–1.2)
Total Protein: 6.7 g/dL (ref 6.1–8.1)

## 2020-04-05 LAB — LIPID PANEL
Cholesterol: 144 mg/dL (ref <200)
HDL: 37 mg/dL — ABNORMAL LOW (ref >=40)
LDL Cholesterol (Calc): 86 mg/dL (calc)
Non-HDL Cholesterol (Calc): 107 mg/dL (calc) (ref <130)
Total CHOL/HDL Ratio: 3.9 (calc) (ref <5.0)
Triglycerides: 116 mg/dL (ref <150)

## 2020-04-11 ENCOUNTER — Other Ambulatory Visit: Payer: Self-pay

## 2020-04-11 MED ORDER — ATORVASTATIN CALCIUM 20 MG PO TABS: 20.0000 mg | ORAL_TABLET | Freq: Every day | ORAL | 1 refills | Status: DC

## 2020-04-11 MED ORDER — LISINOPRIL 10 MG PO TABS: 10.0000 mg | ORAL_TABLET | Freq: Every day | ORAL | 1 refills | Status: DC

## 2020-05-25 ENCOUNTER — Ambulatory Visit: Payer: BC Managed Care – PPO | Admitting: Cardiology

## 2020-06-16 ENCOUNTER — Encounter (INDEPENDENT_AMBULATORY_CARE_PROVIDER_SITE_OTHER): Payer: Self-pay

## 2020-06-16 ENCOUNTER — Ambulatory Visit (INDEPENDENT_AMBULATORY_CARE_PROVIDER_SITE_OTHER): Payer: BC Managed Care – PPO | Admitting: Cardiovascular Disease

## 2020-06-16 ENCOUNTER — Encounter: Payer: Self-pay | Admitting: Cardiovascular Disease

## 2020-06-16 ENCOUNTER — Other Ambulatory Visit: Payer: Self-pay

## 2020-06-16 DIAGNOSIS — R9431 Abnormal electrocardiogram [ECG] [EKG]: Secondary | ICD-10-CM

## 2020-06-16 DIAGNOSIS — I493 Ventricular premature depolarization: Secondary | ICD-10-CM

## 2020-06-16 NOTE — Progress Notes (Signed)
Cardiology Office Note:    Date:  06/16/2020   ID:  Todd Huff, DOB [birth year], MRN [id number]  PCP:  Tammi Sou, MD  [facility] Cardiologist:  Mertie Moores, MD  [facility] Electrophysiologist:  None   Referring MD: Tammi Sou, MD   Chief Complaint  Patient presents with   Abnormal ECG    History of Present Illness:    Todd Huff is a 59 y.o. male with a hx of an abnormal ECG, hyperlipidemia, HTN  And PVCs  ECG in April showed some PVCs , TWI in III and V1  Has rare CP  Occurs at night ,  Lasts for few seconds.   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  Takes propranolol as needed for palps  - usually prior to a large presentation.    Past Medical History:  Diagnosis Date   ALLERGIC RHINITIS 06/23/2008   Anxiety    BENIGN PROSTATIC HYPERTROPHY, HX OF 07/08/2008   Blood in stool    noted 12-22 in stool and toilet paper-? 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   Insomnia    LOW BACK PAIN 06/25/2010   NEPHROLITHIASIS, HX OF 06/23/2008   Psoriasis    methotrexat at one point but changed to home UV light therapy qod and doing well (as of 03/2020)   Vitiligo     Past Surgical History:  Procedure Laterality Date   COLONOSCOPY  2011;2017   2011 adenomatous polyp.  12/29/15 hyperplastic polyps.  Recall 10 yrs.   GANGLION CYST EXCISION Left    wrist    SPERMATOCELECTOMY      Current Medications: Current Meds  Medication Sig    ALPRAZolam (XANAX) 0.25 MG tablet TAKE 1 TABLET BY MOUTH AS NEEDED AS DIRECTED   atorvastatin (LIPITOR) 20 MG tablet Take 1 tablet (20 mg total) by mouth daily.   lisinopril (ZESTRIL) 10 MG tablet Take 1 tablet (10 mg total) by mouth daily.   ondansetron (ZOFRAN) 4 MG tablet 1-2 tabs po tid prn nausea   propranolol (INDERAL) 10 MG tablet Take 1 tablet (10 mg total) by mouth as needed. 1 tab po bid prn     Allergies:   Hydrocodone and Codeine   Social History   Socioeconomic History   Marital status: Married    Spouse name: Not on file   Number of children: Not on file   Years of education: Not on file   Highest education level: Not on file  Occupational History   Not on file  Tobacco Use   Smoking status: Never Smoker   Smokeless tobacco: Never Used  Substance and Sexual Activity   Alcohol use: No    Alcohol/week: 0.0 standard drinks   Drug use: No   Sexual activity: Not on file  Other Topics Concern   Not on file  Social History Narrative   Married, 2 grown sons 2 adult daughters.   Quality improvement Mudlogger for  company [place]   Orig from [place], [place] since his 53s.   No tobacco.   No alcohol.   Social Determinants of Health   Financial Resource Strain:    Difficulty of Paying Living Expenses:   Food Insecurity:    Worried About Charity fundraiser in the Last Year:    Arboriculturist in the Last Year:   Transportation Needs:    Film/video editor (Medical):    Lack of Transportation (Non-Medical):   Physical Activity:    Days of Exercise per Week:    Minutes of Exercise per Session:   Stress:    Feeling of Stress :   Social Connections:    Frequency of Communication with Friends and Family:    Frequency of Social Gatherings with Friends and Family:    Attends Religious Services:    Active Member of Clubs or Organizations:    Attends Archivist Meetings:    Marital Status:      Family History: The patient's family  history includes Colitis in his mother; Colon polyps in his father and mother; Diverticulosis in his mother. There is no history of Colon cancer, Esophageal cancer, Rectal cancer, or Stomach cancer.  ROS:   Please see the history of present illness.     All other systems reviewed and are negative.  EKGs/Labs/Other Studies Reviewed:    The following studies were reviewed today:   EKG:   April 04, 2020: Normal sinus rhythm with occasional premature ventricular contractions.  He has T wave inversions in lead III and lead I which are normal.  No acute changes.  Recent Labs: 10/07/2019: Hemoglobin 16.0; Platelets 160.0; TSH 1.41 04/04/2020: ALT 22; BUN 22; Creat 1.31; Potassium 4.2; Sodium 140  Recent Lipid Panel    Component Value Date/Time   CHOL 144 04/04/2020 0904   TRIG 116 04/04/2020 0904   HDL 37 (L) 04/04/2020 0904   CHOLHDL 3.9 04/04/2020 0904   VLDL 20.0 10/07/2019 0802   LDLCALC 86 04/04/2020 0904    Physical Exam:    VS:  BP 120/66    Pulse 70    Ht 5' 9.5" (1.765 m)    Wt 207 lb (93.9 kg)    SpO2 96%    BMI 30.13 kg/m     Wt Readings from Last 3 Encounters:  06/16/20 207 lb (93.9 kg)  04/04/20 209 lb 12.8 oz (95.2 kg)  10/06/19 202 lb 9.6 oz (91.9 kg)     GEN:  Well nourished, well developed in no acute distress HEENT: Normal NECK: No JVD; No carotid bruits LYMPHATICS: No lymphadenopathy CARDIAC: RRR, no murmurs, rubs, gallops RESPIRATORY:  Clear to auscultation without rales, wheezing or rhonchi  ABDOMEN: Soft, non-tender, non-distended MUSCULOSKELETAL:  No edema; No deformity  SKIN: Warm and dry NEUROLOGIC:  Alert and oriented x 3 PSYCHIATRIC:  Normal affect   ASSESSMENT:    No diagnosis found. PLAN:    In order of problems listed above:  1. Palpitations: He has occasional premature ventricular contractions.  We discussed the benign nature of these.  He has very rare episodes of chest discomfort that occur primarily at night.  I suspect these episodes  are related to his PVCs.  They are not anginal.  He denies any pain while hiking.  He [place] his [place] on [place] regular [place].  His EKG besides showing premature ventricular contractions is otherwise normal.  It is normal to have T wave inversions in lead  III and lead V1.  2.  Hypertension: Blood pressure is now well controlled.  3.  Hyperlipidemia: Continue atorvastatin.  His lipids are managed by his primary medical doctor.  I will see him on an as-needed basis.  I encouraged him to continue to take good care of himself.   Medication Adjustments/Labs and Tests Ordered: Current medicines are reviewed at length with the patient today.  Concerns regarding medicines are outlined above.  No orders of the defined types were placed in this encounter.  No orders of the defined types were placed in this encounter.   Patient Instructions  Medication Instructions:  Your physician recommends that you continue on your current medications as directed. Please refer to the Current Medication list given to you today.  *If you need a refill on your cardiac medications before your next appointment, please call your pharmacy*   Lab Work: None Ordered If you have labs (blood work) drawn today and your tests are completely normal, you will receive your results only by:  [place] (if you have MyChart) OR  A paper copy in the mail If you have any lab test that is abnormal or we need to change your treatment, we will call you to review the results.   Testing/Procedures: None Ordered   Follow-Up: At [facility], you and your health needs are our priority.  As part of our continuing mission to provide you with exceptional heart care, we have created designated Provider Care Teams.  These Care Teams include your primary Cardiologist (physician) and Advanced Practice Providers (APPs -  Physician Assistants and Nurse Practitioners) who all work together to provide you with the  care you need, when you need it.  We recommend signing up for the patient portal called "MyChart".  Sign up information is provided on this After Visit Summary.  MyChart is used to connect with patients for Virtual Visits (Telemedicine).  Patients are able to view lab/test results, encounter notes, upcoming appointments, etc.  Non-urgent messages can be sent to your provider as well.   To learn more about what you can do with MyChart, go to [email or web address].    Your next appointment:    As Needed  The format for your next appointment:   Either In Person or Virtual  Provider:   You may see Mertie Moores, MD or one of the following Advanced Practice Providers on your designated Care Team:    Richardson Dopp, PA-C  Robbie Lis, [place]        Signed, Mertie Moores, MD  06/16/2020 5:25 PM    [place]

## 2020-06-16 NOTE — Patient Instructions (Signed)
Medication Instructions:  Your physician recommends that you continue on your current medications as directed. Please refer to the Current Medication list given to you today.  *If you need a refill on your cardiac medications before your next appointment, please call your pharmacy*   Lab Work: None Ordered If you have labs (blood work) drawn today and your tests are completely normal, you will receive your results only by: [place] Kitchen MyChart Message (if you have MyChart) OR . A paper copy in the mail If you have any lab test that is abnormal or we need to change your treatment, we will call you to review the results.   Testing/Procedures: None Ordered   Follow-Up: At [facility], you and your health needs are our priority.  As part of our continuing mission to provide you with exceptional heart care, we have created designated Provider Care Teams.  These Care Teams include your primary Cardiologist (physician) and Advanced Practice Providers (APPs -  Physician Assistants and Nurse Practitioners) who all work together to provide you with the care you need, when you need it.  We recommend signing up for the patient portal called "MyChart".  Sign up information is provided on this After Visit Summary.  MyChart is used to connect with patients for Virtual Visits (Telemedicine).  Patients are able to view lab/test results, encounter notes, upcoming appointments, etc.  Non-urgent messages can be sent to your provider as well.   To learn more about what you can do with MyChart, go to [email or web address].    Your next appointment:    As Needed  The format for your next appointment:   Either In Person or Virtual  Provider:   You may see Mertie Moores, MD or one of the following Advanced Practice Providers on your designated Care Team:    Richardson Dopp, PA-C  [place], [place]

## 2020-07-21 DIAGNOSIS — R351 Nocturia: Secondary | ICD-10-CM | POA: Diagnosis not present

## 2020-07-21 DIAGNOSIS — N401 Enlarged prostate with lower urinary tract symptoms: Secondary | ICD-10-CM | POA: Diagnosis not present

## 2020-07-27 DIAGNOSIS — L400 Psoriasis vulgaris: Secondary | ICD-10-CM | POA: Diagnosis not present

## 2020-07-27 DIAGNOSIS — Z85828 Personal history of other malignant neoplasm of skin: Secondary | ICD-10-CM | POA: Diagnosis not present

## 2020-07-27 DIAGNOSIS — L80 Vitiligo: Secondary | ICD-10-CM | POA: Diagnosis not present

## 2020-07-27 DIAGNOSIS — D224 Melanocytic nevi of scalp and neck: Secondary | ICD-10-CM | POA: Diagnosis not present

## 2020-07-28 DIAGNOSIS — N401 Enlarged prostate with lower urinary tract symptoms: Secondary | ICD-10-CM | POA: Diagnosis not present

## 2020-07-28 DIAGNOSIS — R3915 Urgency of urination: Secondary | ICD-10-CM | POA: Diagnosis not present

## 2020-07-28 DIAGNOSIS — R351 Nocturia: Secondary | ICD-10-CM | POA: Diagnosis not present

## 2020-10-05 ENCOUNTER — Ambulatory Visit: Payer: BC Managed Care – PPO | Admitting: Family Medicine

## 2020-10-08 ENCOUNTER — Other Ambulatory Visit: Payer: Self-pay | Admitting: Family Medicine

## 2020-10-24 ENCOUNTER — Other Ambulatory Visit: Payer: Self-pay

## 2020-10-25 ENCOUNTER — Encounter: Payer: Self-pay | Admitting: Family Medicine

## 2020-10-25 ENCOUNTER — Ambulatory Visit (INDEPENDENT_AMBULATORY_CARE_PROVIDER_SITE_OTHER): Payer: BC Managed Care – PPO | Admitting: Family Medicine

## 2020-10-25 VITALS — BP 105/65 | HR 60 | Temp 97.6°F | Resp 16 | Ht 69.5 in | Wt 211.0 lb

## 2020-10-25 DIAGNOSIS — I10 Essential (primary) hypertension: Secondary | ICD-10-CM

## 2020-10-25 DIAGNOSIS — E7800 Pure hypercholesterolemia, unspecified: Secondary | ICD-10-CM

## 2020-10-25 DIAGNOSIS — Z0000 Encounter for general adult medical examination without abnormal findings: Secondary | ICD-10-CM | POA: Diagnosis not present

## 2020-10-25 DIAGNOSIS — F419 Anxiety disorder, unspecified: Secondary | ICD-10-CM | POA: Diagnosis not present

## 2020-10-25 LAB — CBC WITH DIFFERENTIAL/PLATELET
Basophils Absolute: 0 10*3/uL (ref 0.0–0.1)
Basophils Relative: 0.8 % (ref 0.0–3.0)
Eosinophils Absolute: 0.1 10*3/uL (ref 0.0–0.7)
Eosinophils Relative: 1.7 % (ref 0.0–5.0)
HCT: 45.4 % (ref 39.0–52.0)
Hemoglobin: 15.5 g/dL (ref 13.0–17.0)
Lymphocytes Relative: 23.9 % (ref 12.0–46.0)
Lymphs Abs: 1.2 10*3/uL (ref 0.7–4.0)
MCHC: 34.2 g/dL (ref 30.0–36.0)
MCV: 87.4 fl (ref 78.0–100.0)
Monocytes Absolute: 0.3 10*3/uL (ref 0.1–1.0)
Monocytes Relative: 7.1 % (ref 3.0–12.0)
Neutro Abs: 3.2 10*3/uL (ref 1.4–7.7)
Neutrophils Relative %: 66.5 % (ref 43.0–77.0)
Platelets: 174 10*3/uL (ref 150.0–400.0)
RBC: 5.19 Mil/uL (ref 4.22–5.81)
RDW: 13.1 % (ref 11.5–15.5)
WBC: 4.8 10*3/uL (ref 4.0–10.5)

## 2020-10-25 LAB — LIPID PANEL
Cholesterol: 129 mg/dL (ref 0–200)
HDL: 39.7 mg/dL (ref >39.00)
LDL Cholesterol: 70 mg/dL (ref 0–99)
NonHDL: 89.3
Total CHOL/HDL Ratio: 3
Triglycerides: 97 mg/dL (ref 0.0–149.0)
VLDL: 19.4 mg/dL (ref 0.0–40.0)

## 2020-10-25 LAB — COMPREHENSIVE METABOLIC PANEL
ALT: 20 U/L (ref 0–53)
AST: 18 U/L (ref 0–37)
Albumin: 4.2 g/dL (ref 3.5–5.2)
Alkaline Phosphatase: 81 U/L (ref 39–117)
BUN: 20 mg/dL (ref 6–23)
CO2: 31 mEq/L (ref 19–32)
Calcium: 8.8 mg/dL (ref 8.4–10.5)
Chloride: 104 mEq/L (ref 96–112)
Creatinine, Ser: 1.25 mg/dL (ref 0.40–1.50)
GFR: 62.41 mL/min (ref >60.00)
Glucose, Bld: 89 mg/dL (ref 70–99)
Potassium: 4.1 mEq/L (ref 3.5–5.1)
Sodium: 140 mEq/L (ref 135–145)
Total Bilirubin: 0.9 mg/dL (ref 0.2–1.2)
Total Protein: 6.2 g/dL (ref 6.0–8.3)

## 2020-10-25 LAB — TSH: TSH: 1.52 u[IU]/mL (ref 0.35–4.50)

## 2020-10-25 NOTE — Progress Notes (Signed)
Office Note 10/25/2020  CC:  Chief Complaint  Patient presents with  . Annual Exam    pt is fasting   HPI:  Todd Huff is a 59 y.o. male who is here for annual health maintenance exam and f/u HLD, HTN, situational anxiety.  Pt feeling well. Diet going well, eliminating high fats, less fried, less red meat. He is active and exercises regularly. HTN: compliant with med.  No home monitoring b/c was always normal on med in the past. HLD: tolerating statin.  He saw cardiologist, Dr. Acie Fredrickson, since last visit with me--for PVCs. He was reassured, no w/u was necessary, told to f/u prn.   He has anxiety for which he uses xanax 0.25mg  bid prn ONLY for public speaking anxiety. Uses this approx once per month. PMP AWARE reviewed today: most recent rx for alpraz 0.25mg  was filled 07/01/19, # [phone], rx by me. No red flags.  Past Medical History:  Diagnosis Date  . ALLERGIC RHINITIS 06/23/2008  . Anxiety   . BENIGN PROSTATIC HYPERTROPHY, HX OF 07/08/2008  . Blood in stool    noted 12-22 in stool and toilet paper-? hemorrhoid- BRB  . Blood transfusion without reported diagnosis    3 units of his own blood   . 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 Hypercholesterolemia 09/2018   Recommended statin 09/2018; pt elected to do TLC  . Hypertension   . Increased prostate specific antigen (PSA) velocity 2019   10/13/18 urol f/u-->surveillance chosen over bx-->f/u urol/rpt PSA 6 mo.  . Insomnia   . LOW BACK PAIN 06/25/2010  . NEPHROLITHIASIS, HX OF 06/23/2008  . Psoriasis    methotrexat at one point but changed to home UV light therapy qod and doing well (as of 03/2020)  . Vitiligo     Past Surgical History:  Procedure Laterality Date  . COLONOSCOPY  2011;2017   2011 adenomatous polyp.  12/29/15 hyperplastic polyps.  Recall 10 yrs.  Marland Kitchen GANGLION CYST EXCISION Left    wrist   . SPERMATOCELECTOMY      Family History  Problem Relation Age of Onset  . Colon polyps Father   . Colitis Mother    . Diverticulosis Mother   . Colon polyps Mother   . Colon cancer Neg Hx   . Esophageal cancer Neg Hx   . Rectal cancer Neg Hx   . Stomach cancer Neg Hx     Social History   Socioeconomic History  . Marital status: Married    Spouse name: Not on file  . Number of children: Not on file  . Years of education: Not on file  . Highest education level: Not on file  Occupational History  . Not on file  Tobacco Use  . Smoking status: Never Smoker  . Smokeless tobacco: Never Used  Substance and Sexual Activity  . Alcohol use: No    Alcohol/week: 0.0 standard drinks  . Drug use: No  . Sexual activity: Not on file  Other Topics Concern  . Not on file  Social History Narrative   Married, 2 grown sons 2 adult daughters.   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   Orig from [place], [place] since his 25s.   No tobacco.   No alcohol.   Social Determinants of Health   Financial Resource Strain:   . Difficulty of Paying Living Expenses: Not on file  Food Insecurity:   . Worried About Charity fundraiser in the Last Year: Not on file  .  Ran Out of Food in the Last Year: Not on file  Transportation Needs:   . Lack of Transportation (Medical): Not on file  . Lack of Transportation (Non-Medical): Not on file  Physical Activity:   . Days of Exercise per Week: Not on file  . Minutes of Exercise per Session: Not on file  Stress:   . Feeling of Stress : Not on file  Social Connections:   . Frequency of Communication with Friends and Family: Not on file  . Frequency of Social Gatherings with Friends and Family: Not on file  . Attends Religious Services: Not on file  . Active Member of Clubs or Organizations: Not on file  . Attends Archivist Meetings: Not on file  . Marital Status: Not on file  Intimate Partner Violence:   . Fear of Current or Ex-Partner: Not on file  . Emotionally Abused: Not on file  . Physically Abused: Not on file  . Sexually Abused: Not on file     Outpatient Medications Prior to Visit  Medication Sig Dispense Refill  . atorvastatin (LIPITOR) 20 MG tablet TAKE 1 TABLET(20 MG) BY MOUTH DAILY 30 tablet 0  . lisinopril (ZESTRIL) 10 MG tablet TAKE 1 TABLET(10 MG) BY MOUTH DAILY 30 tablet 0  . propranolol (INDERAL) 10 MG tablet Take 1 tablet (10 mg total) by mouth as needed. 1 tab po bid prn 60 tablet 5  . tamsulosin (FLOMAX) 0.4 MG CAPS capsule Take 0.4 mg by mouth at bedtime.    . ALPRAZolam (XANAX) 0.25 MG tablet TAKE 1 TABLET BY MOUTH AS NEEDED AS DIRECTED (Patient not taking: Reported on 10/25/2020) 60 tablet 5  . ondansetron (ZOFRAN) 4 MG tablet 1-2 tabs po tid prn nausea (Patient not taking: Reported on 10/25/2020) 30 tablet 1   No facility-administered medications prior to visit.    Allergies  Allergen Reactions  . Hydrocodone Nausea Only  . Codeine Nausea And Vomiting    ROS Review of Systems  Constitutional: Negative for appetite change, chills, fatigue and fever.  HENT: Negative for congestion, dental problem, ear pain and sore throat.   Eyes: Negative for discharge, redness and visual disturbance.  Respiratory: Negative for cough, chest tightness, shortness of breath and wheezing.   Cardiovascular: Negative for chest pain, palpitations and leg swelling.  Gastrointestinal: Negative for abdominal pain, blood in stool, diarrhea, nausea and vomiting.  Genitourinary: Negative for difficulty urinating, dysuria, flank pain, frequency, hematuria and urgency.  Musculoskeletal: Negative for arthralgias, back pain, joint swelling, myalgias and neck stiffness.  Skin: Negative for pallor and rash.  Neurological: Negative for dizziness, speech difficulty, weakness and headaches.  Hematological: Negative for adenopathy. Does not bruise/bleed easily.  Psychiatric/Behavioral: Negative for confusion and sleep disturbance. The patient is not nervous/anxious.     PE; Vitals with BMI 10/25/2020 06/16/2020 04/04/2020  Height 5' 9.5" 5'  9.5" 5' 9.5"  Weight 211 lbs 207 lbs 209 lbs 13 oz  BMI 30.72 42.70 62.37  Systolic 628 315 176  Diastolic 65 66 78  Pulse 60 70 68   Gen: Alert, well appearing.  Patient is oriented to person, place, time, and situation. AFFECT: pleasant, lucid thought and speech. ENT: Ears: EACs clear, normal epithelium.  TMs with good light reflex and landmarks bilaterally.  Eyes: no injection, icteris, swelling, or exudate.  EOMI, PERRLA. Nose: no drainage or turbinate edema/swelling.  No injection or focal lesion.  Mouth: lips without lesion/swelling.  Oral mucosa pink and moist.  Dentition intact and without obvious  caries or gingival swelling.  Oropharynx without erythema, exudate, or swelling.  Neck: supple/nontender.  No LAD, mass, or TM.  Carotid pulses 2+ bilaterally, without bruits. CV: RRR, no m/r/g.   LUNGS: CTA bilat, nonlabored resps, good aeration in all lung fields. ABD: soft, NT, ND, BS normal.  No hepatospenomegaly or mass.  No bruits. EXT: no clubbing, cyanosis, or edema.  Musculoskeletal: no joint swelling, erythema, warmth, or tenderness.  ROM of all joints intact. Skin - no sores or suspicious lesions or rashes or color changes   Pertinent labs:  Lab Results  Component Value Date   TSH 1.41 10/07/2019   Lab Results  Component Value Date   WBC 5.3 10/07/2019   HGB 16.0 10/07/2019   HCT 47.3 10/07/2019   MCV 89.0 10/07/2019   PLT 160.0 10/07/2019   Lab Results  Component Value Date   CREATININE 1.31 (H) 04/04/2020   BUN 22 04/04/2020   NA 140 04/04/2020   K 4.2 04/04/2020   CL 104 04/04/2020   CO2 29 04/04/2020   Lab Results  Component Value Date   ALT 22 04/04/2020   AST 16 04/04/2020   ALKPHOS 75 10/07/2019   BILITOT 0.9 04/04/2020   Lab Results  Component Value Date   CHOL 144 04/04/2020   Lab Results  Component Value Date   HDL 37 (L) 04/04/2020   Lab Results  Component Value Date   LDLCALC 86 04/04/2020   Lab Results  Component Value Date    TRIG 116 04/04/2020   Lab Results  Component Value Date   CHOLHDL 3.9 04/04/2020   Lab Results  Component Value Date   PSA 3.84 09/28/2018   PSA 2.93 09/08/2017   PSA 2.74 05/20/2016    ASSESSMENT AND PLAN:   1) HTN: well controlled. Continue lisinopril 10mg  qd. Lytes/cr today.  2) HLD: tolerating atorva 20mg  qd and we'll continue this and check FLP and hepatic panel today.  3) Anxiety: rare use of alpraz for stressful presentations. No changes today. No new rx needed today.  4) Health maintenance exam: Reviewed age and gender appropriate health maintenance issues (prudent diet, regular exercise, health risks of tobacco and excessive alcohol, use of seatbelts, fire alarms in home, use of sunscreen).  Also reviewed age and gender appropriate health screening as well as vaccine recommendations. Vaccines: Tdap booster-->pt declines.   Flu-->UTD.  All other UTD. Labs: CBC, CMET, FLP, TSH. Prostate ca screening: hx of inc PSA velocity, getting PSA surveillance by urologist. Colon ca screening: recall 2027.  An After Visit Summary was printed and given to the patient.  FOLLOW UP:  Return in about 6 months (around 04/24/2021) for routine chronic illness f/u.  Signed:  Crissie Sickles, MD           10/25/2020

## 2020-10-25 NOTE — Patient Instructions (Signed)

## 2020-11-07 ENCOUNTER — Other Ambulatory Visit: Payer: Self-pay | Admitting: Family Medicine

## 2021-01-22 DIAGNOSIS — R351 Nocturia: Secondary | ICD-10-CM | POA: Diagnosis not present

## 2021-01-22 DIAGNOSIS — R972 Elevated prostate specific antigen [PSA]: Secondary | ICD-10-CM | POA: Diagnosis not present

## 2021-01-22 DIAGNOSIS — N401 Enlarged prostate with lower urinary tract symptoms: Secondary | ICD-10-CM | POA: Diagnosis not present

## 2021-04-24 ENCOUNTER — Ambulatory Visit: Payer: BC Managed Care – PPO | Admitting: Family Medicine

## 2021-04-26 ENCOUNTER — Telehealth (INDEPENDENT_AMBULATORY_CARE_PROVIDER_SITE_OTHER): Payer: BC Managed Care – PPO | Admitting: Family Medicine

## 2021-04-26 ENCOUNTER — Other Ambulatory Visit: Payer: Self-pay

## 2021-04-26 ENCOUNTER — Encounter: Payer: Self-pay | Admitting: Family Medicine

## 2021-04-26 DIAGNOSIS — J069 Acute upper respiratory infection, unspecified: Secondary | ICD-10-CM

## 2021-04-26 DIAGNOSIS — B349 Viral infection, unspecified: Secondary | ICD-10-CM | POA: Diagnosis not present

## 2021-04-26 NOTE — Progress Notes (Signed)
Virtual Visit via Video Note  I connected with pt on 04/26/21 at  4:00 PM EDT by a video enabled telemedicine application and verified that I am speaking with the correct person using two identifiers.  Location patient: home, [place] Location provider:work or home office Persons participating in the virtual visit: patient, provider  I discussed the limitations of evaluation and management by telemedicine and the availability of in person appointments. The patient expressed understanding and agreed to proceed.  Telemedicine visit is a necessity given the COVID-19 restrictions in place at the current time.  HPI: 60 y/o male with hx of HTN, HLD, and obesity being seen today for "sinus congestion and cold sweat". Pt reports acute onset 2 d/a of runny nose, bad ST, and HA.  Over the last 48h he has started to develop a mild nonproductive cough.  Today at work felt cold sweats.  No known/documented fever, no body aches, no n/v/d, no rash, no SOB, no dizziness, no CP.  Tried some dayquil. Sister in law with same sx's starting one day prior to him. Reports negative covid test 2 d/a (first day of illness). Has had covid inj x 3 (booster was 10/09/20) Flu vaccine 09/14/20.  ROS: See pertinent positives and negatives per HPI.  Past Medical History:  Diagnosis Date  . ALLERGIC RHINITIS 06/23/2008  . Anxiety   . BENIGN PROSTATIC HYPERTROPHY, HX OF 07/08/2008  . Blood in stool    noted 12-22 in stool and toilet paper-? hemorrhoid- BRB  . Blood transfusion without reported diagnosis   . Chronic renal insufficiency, stage 2 (mild)    GFR low 60s  . Hypercholesterolemia 09/2018   Recommended statin 09/2018; pt elected to do TLC  . Hypertension   . Increased prostate specific antigen (PSA) velocity 2019   10/13/18 urol f/u-->surveillance chosen over bx-->f/u urol/rpt PSA 6 mo.  . Insomnia   . LOW BACK PAIN 06/25/2010  . NEPHROLITHIASIS, HX OF 06/23/2008  . Palpitations 03/2020   EKGs with PVCs-->cardiol  saw him and said no further w/u necessary.  . Psoriasis    methotrexat at one point but changed to home UV light therapy qod and doing well (as of 03/2020)  . Vitiligo     Past Surgical History:  Procedure Laterality Date  . COLONOSCOPY  2011;2017   2011 adenomatous polyp.  12/29/15 hyperplastic polyps.  Recall 10 yrs.  Marland Kitchen GANGLION CYST EXCISION Left    wrist   . SPERMATOCELECTOMY       Current Outpatient Medications:  .  atorvastatin (LIPITOR) 20 MG tablet, TAKE 1 TABLET(20 MG) BY MOUTH DAILY, Disp: 30 tablet, Rfl: 5 .  lisinopril (ZESTRIL) 10 MG tablet, TAKE 1 TABLET(10 MG) BY MOUTH DAILY, Disp: 30 tablet, Rfl: 5 .  ALPRAZolam (XANAX) 0.25 MG tablet, TAKE 1 TABLET BY MOUTH AS NEEDED AS DIRECTED (Patient not taking: No sig reported), Disp: 60 tablet, Rfl: 5 .  ondansetron (ZOFRAN) 4 MG tablet, 1-2 tabs po tid prn nausea (Patient not taking: No sig reported), Disp: 30 tablet, Rfl: 1 .  propranolol (INDERAL) 10 MG tablet, Take 1 tablet (10 mg total) by mouth as needed. 1 tab po bid prn (Patient not taking: Reported on 04/26/2021), Disp: 60 tablet, Rfl: 5  EXAM:  VITALS per patient if applicable:  Vitals with BMI 10/25/2020 06/16/2020 04/04/2020  Height 5' 9.5" 5' 9.5" 5' 9.5"  Weight 211 lbs 207 lbs 209 lbs 13 oz  BMI 30.72 16.10 96.04  Systolic 540 981 191  Diastolic 65 66  78  Pulse 60 70 68    GENERAL: alert, oriented, appears well and in no acute distress  HEENT: atraumatic, conjunttiva clear, no obvious abnormalities on inspection of external nose and ears  NECK: normal movements of the head and neck  LUNGS: on inspection no signs of respiratory distress, breathing rate appears normal, no obvious gross SOB, gasping or wheezing  CV: no obvious cyanosis  MS: moves all visible extremities without noticeable abnormality  PSYCH/NEURO: pleasant and cooperative, no obvious depression or anxiety, speech and thought processing grossly intact  LABS: none today    Chemistry       Component Value Date/Time   NA 140 10/25/2020 1030   K 4.1 10/25/2020 1030   CL 104 10/25/2020 1030   CO2 31 10/25/2020 1030   BUN 20 10/25/2020 1030   CREATININE 1.25 10/25/2020 1030   CREATININE 1.31 (H) 04/04/2020 0904      Component Value Date/Time   CALCIUM 8.8 10/25/2020 1030   ALKPHOS 81 10/25/2020 1030   AST 18 10/25/2020 1030   ALT 20 10/25/2020 1030   BILITOT 0.9 10/25/2020 1030      ASSESSMENT AND PLAN:  Discussed the following assessment and plan:  Viral URI, mild systemic sx's ("cold sweats" and fatigue) today. Symptomatic care discussed---tylenol 1000mg  q6h or ibup 600mg  tid prn. Saline nasal spray prn. He'll come by our office tomorrow for covid swab and rapid strep swab.  I discussed the assessment and treatment plan with the patient. The patient was provided an opportunity to ask questions and all were answered. The patient agreed with the plan and demonstrated an understanding of the instructions.   F/u: if not signif improved in 4-5d  Signed:  Crissie Sickles, MD           04/26/2021

## 2021-04-26 NOTE — Addendum Note (Signed)
Addended by: Deveron Furlong D on: 04/26/2021 04:26 PM   Modules accepted: Orders

## 2021-04-27 ENCOUNTER — Ambulatory Visit (INDEPENDENT_AMBULATORY_CARE_PROVIDER_SITE_OTHER): Payer: BC Managed Care – PPO

## 2021-04-27 DIAGNOSIS — J069 Acute upper respiratory infection, unspecified: Secondary | ICD-10-CM | POA: Diagnosis not present

## 2021-04-27 DIAGNOSIS — B349 Viral infection, unspecified: Secondary | ICD-10-CM | POA: Diagnosis not present

## 2021-04-27 LAB — POCT RAPID STREP A (OFFICE): Rapid Strep A Screen: NEGATIVE

## 2021-04-27 NOTE — Progress Notes (Signed)
Pt here for swabbing.

## 2021-04-28 LAB — SARS-COV-2, NAA 2 DAY TAT

## 2021-04-28 LAB — NOVEL CORONAVIRUS, NAA: SARS-CoV-2, NAA: NOT DETECTED

## 2021-05-06 ENCOUNTER — Other Ambulatory Visit: Payer: Self-pay | Admitting: Family Medicine

## 2021-05-18 ENCOUNTER — Other Ambulatory Visit: Payer: Self-pay

## 2021-05-21 ENCOUNTER — Encounter: Payer: Self-pay | Admitting: Family Medicine

## 2021-05-21 ENCOUNTER — Other Ambulatory Visit: Payer: Self-pay

## 2021-05-21 ENCOUNTER — Ambulatory Visit: Payer: BC Managed Care – PPO | Admitting: Family Medicine

## 2021-05-21 VITALS — BP 101/67 | HR 61 | Temp 97.6°F | Resp 16 | Ht 69.5 in | Wt 208.4 lb

## 2021-05-21 DIAGNOSIS — F418 Other specified anxiety disorders: Secondary | ICD-10-CM

## 2021-05-21 DIAGNOSIS — I10 Essential (primary) hypertension: Secondary | ICD-10-CM | POA: Diagnosis not present

## 2021-05-21 DIAGNOSIS — Z79899 Other long term (current) drug therapy: Secondary | ICD-10-CM | POA: Diagnosis not present

## 2021-05-21 DIAGNOSIS — E7800 Pure hypercholesterolemia, unspecified: Secondary | ICD-10-CM | POA: Diagnosis not present

## 2021-05-21 MED ORDER — ATORVASTATIN CALCIUM 20 MG PO TABS: ORAL_TABLET | ORAL | 3 refills | Status: DC

## 2021-05-21 MED ORDER — LISINOPRIL 10 MG PO TABS: ORAL_TABLET | ORAL | 3 refills | Status: DC

## 2021-05-21 NOTE — Progress Notes (Signed)
OFFICE VISIT  05/21/2021  CC:  Chief Complaint  Patient presents with  . Follow-up    RCI, pt is fasting    HPI:    Patient is a 60 y.o. Caucasian male who presents for 7 mo f/u HTN, HLD, and situational anxiety (high risk med use). Doing well, no complaints. Walking 3 mi daily for exercise. Eating healthy.    HTN: home bp's consistently <130/80  HLD: atorva 20mg  qd, no probs   Anxiety: uses alprazolam 0.25mg  qd prn for speaking in big meetings/public speaking--uses sparingly. PMP AWARE reviewed today: most recent rx for alprazolam was filled 07/01/19, # 15, rx by me. No red flags.  ROS as above, plus--> no fevers, no CP, no SOB, no wheezing, no cough, no dizziness, no HAs, no rashes, no melena/hematochezia.  No polyuria or polydipsia.  No myalgias or arthralgias.  No focal weakness, paresthesias, or tremors.  No acute vision or hearing abnormalities.  No dysuria or unusual/new urinary urgency or frequency.  No recent changes in lower legs. No n/v/d or abd pain.  No palpitations.    Past Medical History:  Diagnosis Date  . ALLERGIC RHINITIS 06/23/2008  . Anxiety   . BENIGN PROSTATIC HYPERTROPHY, HX OF 07/08/2008  . Blood in stool    noted 12-22 in stool and toilet paper-? hemorrhoid- BRB  . Blood transfusion without reported diagnosis   . Chronic renal insufficiency, stage 2 (mild)    GFR low 60s  . Hypercholesterolemia 09/2018   Recommended statin 09/2018; pt elected to do TLC  . Hypertension   . Increased prostate specific antigen (PSA) velocity 2019   10/13/18 urol f/u-->surveillance chosen over bx-->f/u urol/rpt PSA 6 mo.  . Insomnia   . LOW BACK PAIN 06/25/2010  . NEPHROLITHIASIS, HX OF 06/23/2008  . Palpitations 03/2020   EKGs with PVCs-->cardiol saw him and said no further w/u necessary.  . Psoriasis    methotrexat at one point but changed to home UV light therapy qod and doing well (as of 03/2020)  . Vitiligo     Past Surgical History:  Procedure Laterality Date   . COLONOSCOPY  2011;2017   2011 adenomatous polyp.  12/29/15 hyperplastic polyps.  Recall 10 yrs.  Marland Kitchen GANGLION CYST EXCISION Left    wrist   . SPERMATOCELECTOMY      Outpatient Medications Prior to Visit  Medication Sig Dispense Refill  . ondansetron (ZOFRAN) 4 MG tablet 1-2 tabs po tid prn nausea 30 tablet 1  . propranolol (INDERAL) 10 MG tablet Take 1 tablet (10 mg total) by mouth as needed. 1 tab po bid prn 60 tablet 5  . tamsulosin (FLOMAX) 0.4 MG CAPS capsule Take 0.4 mg by mouth daily.    Marland Kitchen atorvastatin (LIPITOR) 20 MG tablet TAKE 1 TABLET(20 MG) BY MOUTH DAILY 30 tablet 0  . lisinopril (ZESTRIL) 10 MG tablet TAKE 1 TABLET(10 MG) BY MOUTH DAILY 30 tablet 0  . ALPRAZolam (XANAX) 0.25 MG tablet TAKE 1 TABLET BY MOUTH AS NEEDED AS DIRECTED (Patient not taking: Reported on 05/21/2021) 60 tablet 5   No facility-administered medications prior to visit.    Allergies  Allergen Reactions  . Hydrocodone Nausea Only  . Codeine Nausea And Vomiting    ROS As per HPI  PE: Vitals with BMI 05/21/2021 10/25/2020 06/16/2020  Height 5' 9.5" 5' 9.5" 5' 9.5"  Weight 208 lbs 6 oz 211 lbs 207 lbs  BMI 30.34 16.10 96.04  Systolic 540 981 191  Diastolic 67 65 66  Pulse  61 60 70     Gen: Alert, well appearing.  Patient is oriented to person, place, time, and situation. AFFECT: pleasant, lucid thought and speech. CV: RRR, no m/r/g.   LUNGS: CTA bilat, nonlabored resps, good aeration in all lung fields. EXT: no clubbing or cyanosis.  no edema.   LABS:  Lab Results  Component Value Date   TSH 1.52 10/25/2020   Lab Results  Component Value Date   WBC 4.8 10/25/2020   HGB 15.5 10/25/2020   HCT 45.4 10/25/2020   MCV 87.4 10/25/2020   PLT 174.0 10/25/2020   Lab Results  Component Value Date   CREATININE 1.25 10/25/2020   BUN 20 10/25/2020   NA 140 10/25/2020   K 4.1 10/25/2020   CL 104 10/25/2020   CO2 31 10/25/2020   Lab Results  Component Value Date   ALT 20 10/25/2020   AST 18  10/25/2020   ALKPHOS 81 10/25/2020   BILITOT 0.9 10/25/2020   Lab Results  Component Value Date   CHOL 129 10/25/2020   Lab Results  Component Value Date   HDL 39.70 10/25/2020   Lab Results  Component Value Date   LDLCALC 70 10/25/2020   Lab Results  Component Value Date   TRIG 97.0 10/25/2020   Lab Results  Component Value Date   CHOLHDL 3 10/25/2020   Lab Results  Component Value Date   PSA 3.84 09/28/2018   PSA 2.93 09/08/2017   PSA 2.74 05/20/2016   IMPRESSION AND PLAN:  1) HTN; well controlled. Cont lisinopril 10mg  qd. Lytes/cr today.  2) HLD: tolerating atorva 20mg  qd. FLP and hepatic panel today.  3) Situational anxiety: rare use of alpraz 0.25mg  for public speaking. No new rx needed today.  An After Visit Summary was printed and given to the patient.  FOLLOW UP: Return in about 6 months (around 11/20/2021) for annual CPE (fasting).  Signed:  Crissie Sickles, MD           05/21/2021

## 2021-05-22 LAB — COMPREHENSIVE METABOLIC PANEL
ALT: 31 U/L (ref 0–53)
AST: 26 U/L (ref 0–37)
Albumin: 4.3 g/dL (ref 3.5–5.2)
Alkaline Phosphatase: 81 U/L (ref 39–117)
BUN: 17 mg/dL (ref 6–23)
CO2: 28 mEq/L (ref 19–32)
Calcium: 9.1 mg/dL (ref 8.4–10.5)
Chloride: 103 mEq/L (ref 96–112)
Creatinine, Ser: 1.35 mg/dL (ref 0.40–1.50)
GFR: 56.67 mL/min — ABNORMAL LOW (ref >60.00)
Glucose, Bld: 100 mg/dL — ABNORMAL HIGH (ref 70–99)
Potassium: 4 mEq/L (ref 3.5–5.1)
Sodium: 139 mEq/L (ref 135–145)
Total Bilirubin: 0.8 mg/dL (ref 0.2–1.2)
Total Protein: 6.3 g/dL (ref 6.0–8.3)

## 2021-05-22 LAB — LIPID PANEL
Cholesterol: 128 mg/dL (ref 0–200)
HDL: 40.6 mg/dL (ref >39.00)
LDL Cholesterol: 64 mg/dL (ref 0–99)
NonHDL: 87.24
Total CHOL/HDL Ratio: 3
Triglycerides: 114 mg/dL (ref 0.0–149.0)
VLDL: 22.8 mg/dL (ref 0.0–40.0)

## 2021-07-17 DIAGNOSIS — N401 Enlarged prostate with lower urinary tract symptoms: Secondary | ICD-10-CM | POA: Diagnosis not present

## 2021-07-17 DIAGNOSIS — R3915 Urgency of urination: Secondary | ICD-10-CM | POA: Diagnosis not present

## 2021-07-23 DIAGNOSIS — R972 Elevated prostate specific antigen [PSA]: Secondary | ICD-10-CM | POA: Diagnosis not present

## 2021-07-23 DIAGNOSIS — N401 Enlarged prostate with lower urinary tract symptoms: Secondary | ICD-10-CM | POA: Diagnosis not present

## 2021-07-23 DIAGNOSIS — R3915 Urgency of urination: Secondary | ICD-10-CM | POA: Diagnosis not present

## 2021-11-19 ENCOUNTER — Ambulatory Visit (INDEPENDENT_AMBULATORY_CARE_PROVIDER_SITE_OTHER): Payer: BC Managed Care – PPO | Admitting: Family Medicine

## 2021-11-19 ENCOUNTER — Encounter: Payer: Self-pay | Admitting: Family Medicine

## 2021-11-19 ENCOUNTER — Other Ambulatory Visit: Payer: Self-pay

## 2021-11-19 VITALS — BP 108/72 | HR 67 | Temp 97.6°F | Ht 71.0 in | Wt 211.0 lb

## 2021-11-19 DIAGNOSIS — Z0000 Encounter for general adult medical examination without abnormal findings: Secondary | ICD-10-CM | POA: Diagnosis not present

## 2021-11-19 DIAGNOSIS — Z125 Encounter for screening for malignant neoplasm of prostate: Secondary | ICD-10-CM | POA: Diagnosis not present

## 2021-11-19 DIAGNOSIS — E7800 Pure hypercholesterolemia, unspecified: Secondary | ICD-10-CM

## 2021-11-19 DIAGNOSIS — I10 Essential (primary) hypertension: Secondary | ICD-10-CM

## 2021-11-19 LAB — COMPREHENSIVE METABOLIC PANEL
ALT: 24 U/L (ref 0–53)
AST: 20 U/L (ref 0–37)
Albumin: 4.4 g/dL (ref 3.5–5.2)
Alkaline Phosphatase: 78 U/L (ref 39–117)
BUN: 24 mg/dL — ABNORMAL HIGH (ref 6–23)
CO2: 30 mEq/L (ref 19–32)
Calcium: 9.3 mg/dL (ref 8.4–10.5)
Chloride: 103 mEq/L (ref 96–112)
Creatinine, Ser: 1.35 mg/dL (ref 0.40–1.50)
GFR: 56.48 mL/min — ABNORMAL LOW (ref >60.00)
Glucose, Bld: 91 mg/dL (ref 70–99)
Potassium: 4.1 mEq/L (ref 3.5–5.1)
Sodium: 140 mEq/L (ref 135–145)
Total Bilirubin: 1 mg/dL (ref 0.2–1.2)
Total Protein: 6.5 g/dL (ref 6.0–8.3)

## 2021-11-19 LAB — CBC WITH DIFFERENTIAL/PLATELET
Basophils Absolute: 0 10*3/uL (ref 0.0–0.1)
Basophils Relative: 0.5 % (ref 0.0–3.0)
Eosinophils Absolute: 0.2 10*3/uL (ref 0.0–0.7)
Eosinophils Relative: 3.4 % (ref 0.0–5.0)
HCT: 46.9 % (ref 39.0–52.0)
Hemoglobin: 15.5 g/dL (ref 13.0–17.0)
Lymphocytes Relative: 27.6 % (ref 12.0–46.0)
Lymphs Abs: 1.3 10*3/uL (ref 0.7–4.0)
MCHC: 33.2 g/dL (ref 30.0–36.0)
MCV: 88.4 fl (ref 78.0–100.0)
Monocytes Absolute: 0.4 10*3/uL (ref 0.1–1.0)
Monocytes Relative: 8.2 % (ref 3.0–12.0)
Neutro Abs: 2.8 10*3/uL (ref 1.4–7.7)
Neutrophils Relative %: 60.3 % (ref 43.0–77.0)
Platelets: 178 10*3/uL (ref 150.0–400.0)
RBC: 5.3 Mil/uL (ref 4.22–5.81)
RDW: 13.4 % (ref 11.5–15.5)
WBC: 4.7 10*3/uL (ref 4.0–10.5)

## 2021-11-19 LAB — LIPID PANEL
Cholesterol: 135 mg/dL (ref 0–200)
HDL: 42.6 mg/dL (ref >39.00)
LDL Cholesterol: 73 mg/dL (ref 0–99)
NonHDL: 92.27
Total CHOL/HDL Ratio: 3
Triglycerides: 96 mg/dL (ref 0.0–149.0)
VLDL: 19.2 mg/dL (ref 0.0–40.0)

## 2021-11-19 LAB — PSA: PSA: 3.72 ng/mL (ref 0.10–4.00)

## 2021-11-19 LAB — TSH: TSH: 1.73 u[IU]/mL (ref 0.35–5.50)

## 2021-11-19 NOTE — Progress Notes (Signed)
Office Note 11/19/2021  CC:  Chief Complaint  Patient presents with   Annual Exam     HPI:  Patient is a 59 y.o. male who is here for annual health maintenance exam and 6 mo f/u HTN, HLD, and situational anxiety (high risk med use). Overall the patient feels well. He denies HTN symptoms (headache, blurry vision) and continues to tolerate lisinopril 10 mg qd. The patient continues to take Atorva 20 mg qd (no mention of muscle pain or constipation) for HLD and is making effort to eat a better diet and exercise. Patients situational anxiety continues to be well controlled with Propranolol 10 mg for which he takes as needed. Currently he takes Tamsulosin 0.4 mg for BPH. He mentioned he has a decreased urgency to urinate and his urine stream is decreasing in strength; however, he is not concerned. ROS negative (cough, congestion, abdominal pain).  Past Medical History:  Diagnosis Date   ALLERGIC RHINITIS 06/23/2008   Anxiety    BENIGN PROSTATIC HYPERTROPHY, HX OF 07/08/2008   Blood in stool    noted 12-22 in stool and toilet paper-? hemorrhoid- BRB   Blood transfusion without reported diagnosis    Chronic renal insufficiency, stage 2 (mild)    GFR low 60s   Hypercholesterolemia 09/2018   Recommended statin 09/2018; pt elected to do TLC   Hypertension    Increased prostate specific antigen (PSA) velocity 2019   10/13/18 urol f/u-->surveillance chosen over bx-->f/u urol/rpt PSA 6 mo.   Insomnia    LOW BACK PAIN 06/25/2010   NEPHROLITHIASIS, HX OF 06/23/2008   Palpitations 03/2020   EKGs with PVCs-->cardiol saw him and said no further w/u necessary.   Psoriasis    methotrexat at one point but changed to home UV light therapy qod and doing well (as of 03/2020)   Vitiligo     Past Surgical History:  Procedure Laterality Date   COLONOSCOPY  2011;2017   2011 adenomatous polyp.  12/29/15 hyperplastic polyps.  Recall 10 yrs.   GANGLION CYST EXCISION Left    wrist    SPERMATOCELECTOMY       Family History  Problem Relation Age of Onset   Colon polyps Father    Colitis Mother    Diverticulosis Mother    Colon polyps Mother    Colon cancer Neg Hx    Esophageal cancer Neg Hx    Rectal cancer Neg Hx    Stomach cancer Neg Hx     Social History   Socioeconomic History   Marital status: Married    Spouse name: Not on file   Number of children: Not on file   Years of education: Not on file   Highest education level: Not on file  Occupational History   Not on file  Tobacco Use   Smoking status: Never   Smokeless tobacco: Never  Substance and Sexual Activity   Alcohol use: No    Alcohol/week: 0.0 standard drinks   Drug use: No   Sexual activity: Not on file  Other Topics Concern   Not on file  Social History Narrative   Married, 2 grown sons 2 adult daughters.   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   Orig from [place], [place] since his 57s.   No tobacco.   No alcohol.   Social Determinants of Health   Financial Resource Strain: Not on file  Food Insecurity: Not on file  Transportation Needs: Not on file  Physical Activity: Not on file  Stress: Not  on file  Social Connections: Not on file  Intimate Partner Violence: Not on file    Outpatient Medications Prior to Visit  Medication Sig Dispense Refill   ALPRAZolam (XANAX) 0.25 MG tablet TAKE 1 TABLET BY MOUTH AS NEEDED AS DIRECTED (Patient not taking: Reported on 05/21/2021) 60 tablet 5   atorvastatin (LIPITOR) 20 MG tablet TAKE 1 TABLET(20 MG) BY MOUTH DAILY 90 tablet 3   lisinopril (ZESTRIL) 10 MG tablet TAKE 1 TABLET(10 MG) BY MOUTH DAILY 90 tablet 3   ondansetron (ZOFRAN) 4 MG tablet 1-2 tabs po tid prn nausea 30 tablet 1   propranolol (INDERAL) 10 MG tablet Take 1 tablet (10 mg total) by mouth as needed. 1 tab po bid prn 60 tablet 5   tamsulosin (FLOMAX) 0.4 MG CAPS capsule Take 0.4 mg by mouth daily.     No facility-administered medications prior to visit.    Allergies  Allergen Reactions    Hydrocodone Nausea Only   Codeine Nausea And Vomiting    ROS Per HPI  PE; Vitals with BMI 11/19/2021 05/21/2021 10/25/2020  Height 5\' 11"  5' 9.5" 5' 9.5"  Weight 211 lbs 208 lbs 6 oz 211 lbs  BMI 29.44 58.52 77.82  Systolic 423 536 144  Diastolic 72 67 65  Pulse 67 61 60   Physical Exam Constitutional:      Appearance: Normal appearance.  Cardiovascular:     Rate and Rhythm: Normal rate and regular rhythm.     Heart sounds: Normal heart sounds.  Pulmonary:     Effort: Pulmonary effort is normal.     Breath sounds: Normal breath sounds.  Abdominal:     General: Abdomen is flat. Bowel sounds are normal.     Palpations: Abdomen is soft.  Neurological:     Mental Status: He is alert.  Psychiatric:        Mood and Affect: Mood normal.        Behavior: Behavior normal.     Pertinent labs:  Lab Results  Component Value Date   TSH 1.52 10/25/2020   Lab Results  Component Value Date   WBC 4.8 10/25/2020   HGB 15.5 10/25/2020   HCT 45.4 10/25/2020   MCV 87.4 10/25/2020   PLT 174.0 10/25/2020   Lab Results  Component Value Date   CREATININE 1.35 05/21/2021   BUN 17 05/21/2021   NA 139 05/21/2021   K 4.0 05/21/2021   CL 103 05/21/2021   CO2 28 05/21/2021   Lab Results  Component Value Date   ALT 31 05/21/2021   AST 26 05/21/2021   ALKPHOS 81 05/21/2021   BILITOT 0.8 05/21/2021   Lab Results  Component Value Date   CHOL 128 05/21/2021   Lab Results  Component Value Date   HDL 40.60 05/21/2021   Lab Results  Component Value Date   LDLCALC 64 05/21/2021   Lab Results  Component Value Date   TRIG 114.0 05/21/2021   Lab Results  Component Value Date   CHOLHDL 3 05/21/2021   Lab Results  Component Value Date   PSA 3.84 09/28/2018   PSA 2.93 09/08/2017   PSA 2.74 05/20/2016   ASSESSMENT AND PLAN:  Non-toic appearing male who presents for PE and follow up for HTN, HLD, and anxiety.  1) HTN: stable. BP today is 108/72 and HTN are not present.  Continue lisinopril 10 mg qd. Will get CBC and CMP today.  2) HLD: stable. Last lipid panel normal (05-21-21), repeat lipid panel today.  Continue Atorvastatin 20 mg daily.   3) Situational anxiety: stable. Patient feels anxiety levels are stable and its well controlled on Propranolol 10 mg. Continue medication.  4) BPH: stable given urinary frequency is decreased and there aren't any concerns of obstruction in the urinary tract. Continue Tamsulosin 0.4 mg daily. PSA today  5) Health maintenance exam: Reviewed age and gender appropriate health maintenance issues (prudent diet, regular exercise, health risks of tobacco and excessive alcohol, use of seatbelts, fire alarms in home, use of sunscreen).  Also reviewed age and gender appropriate health screening as well as vaccine recommendations. Vaccines: tdap->dec.  Flu->dec.  Shingrix->dec. Lab Orders         CBC with Differential/Platelet         Comprehensive metabolic panel         Lipid panel         TSH         PSA    Prostate ca screening: PSA today. Colon ca screening: recall 2027  An After Visit Summary was printed and given to the patient.  FOLLOW UP:  Return in about 6 months (around 05/20/2022) for routine chronic illness f/u.  Phil Dopp -MS3  Signed:  Crissie Sickles, MD           11/19/2021

## 2021-11-19 NOTE — Patient Instructions (Signed)

## 2021-11-19 NOTE — Progress Notes (Signed)
Pls see student note for this encounter. I have attested it. Signed:  Crissie Sickles, MD           11/19/2021

## 2021-12-19 ENCOUNTER — Emergency Department
Admission: EM | Admit: 2021-12-19 | Discharge: 2021-12-19 | Disposition: A | Discharge status: Home / Self Care | Payer: BC Managed Care – PPO | Source: Home / Self Care

## 2021-12-19 ENCOUNTER — Other Ambulatory Visit: Payer: Self-pay

## 2021-12-19 ENCOUNTER — Telehealth (INDEPENDENT_AMBULATORY_CARE_PROVIDER_SITE_OTHER): Payer: BC Managed Care – PPO | Admitting: Registered Nurse

## 2021-12-19 DIAGNOSIS — N419 Inflammatory disease of prostate, unspecified: Secondary | ICD-10-CM

## 2021-12-19 DIAGNOSIS — B029 Zoster without complications: Secondary | ICD-10-CM

## 2021-12-19 DIAGNOSIS — N411 Chronic prostatitis: Secondary | ICD-10-CM | POA: Diagnosis not present

## 2021-12-19 DIAGNOSIS — R350 Frequency of micturition: Secondary | ICD-10-CM

## 2021-12-19 DIAGNOSIS — R8271 Bacteriuria: Secondary | ICD-10-CM | POA: Diagnosis not present

## 2021-12-19 DIAGNOSIS — N401 Enlarged prostate with lower urinary tract symptoms: Secondary | ICD-10-CM | POA: Diagnosis not present

## 2021-12-19 DIAGNOSIS — R300 Dysuria: Secondary | ICD-10-CM | POA: Diagnosis not present

## 2021-12-19 DIAGNOSIS — R3915 Urgency of urination: Secondary | ICD-10-CM | POA: Diagnosis not present

## 2021-12-19 LAB — POCT URINALYSIS DIP (MANUAL ENTRY)
Bilirubin, UA: NEGATIVE
Blood, UA: NEGATIVE
Glucose, UA: NEGATIVE mg/dL
Ketones, POC UA: NEGATIVE mg/dL
Leukocytes, UA: NEGATIVE
Nitrite, UA: NEGATIVE
Protein Ur, POC: NEGATIVE mg/dL
Spec Grav, UA: 1.02 (ref 1.010–1.025)
Urobilinogen, UA: 0.2 E.U./dL
pH, UA: 7 (ref 5.0–8.0)

## 2021-12-19 MED ORDER — GABAPENTIN 100 MG PO CAPS: 100.0000 mg | ORAL_CAPSULE | Freq: Three times a day (TID) | ORAL | 3 refills | Status: DC

## 2021-12-19 MED ORDER — VALACYCLOVIR HCL 1 G PO TABS: 1000.0000 mg | ORAL_TABLET | Freq: Two times a day (BID) | ORAL | 0 refills | Status: AC

## 2021-12-19 MED ORDER — SULFAMETHOXAZOLE-TRIMETHOPRIM 800-160 MG PO TABS: 1.0000 | ORAL_TABLET | Freq: Two times a day (BID) | ORAL | 0 refills | Status: AC

## 2021-12-19 NOTE — Patient Instructions (Signed)
° ° ° °  If you have lab work done today you will be contacted with your lab results within the next 2 weeks.  If you have not heard from us then please contact us. The fastest way to get your results is to register for My Chart. ° ° °IF you received an x-ray today, you will receive an invoice from [facility]. Please contact [facility] at [phone] with questions or concerns regarding your invoice.  ° °IF you received labwork today, you will receive an invoice from [facility]. Please contact [facility] at [phone] with questions or concerns regarding your invoice.  ° °Our billing staff will not be able to assist you with questions regarding bills from these companies. ° °You will be contacted with the lab results as soon as they are available. The fastest way to get your results is to activate your My Chart account. Instructions are located on the last page of this paperwork. If you have not heard from us regarding the results in 2 weeks, please contact this office. °  ° ° ° °

## 2021-12-19 NOTE — Discharge Instructions (Addendum)
Advised patient urinalysis was completely negative/unremarkable and unable to obtain urinary culture because of this.  Advised patient we will treat empirically for prostatitis with Bactrim for the next 10 days.  Advised patient if symptoms worsen and/or unresolved please follow-up with PCP or Urology for further evaluation.  Patient to take medication as directed with food to completion.  Encouraged patient to increase daily water intake while taking this medication.

## 2021-12-19 NOTE — ED Triage Notes (Signed)
Pt states that he has some pain with urination and urinary frequency. X1 week  Pt states that he has had prostatitis before. Pt states that he couldn't get an appt with urologist or primary care provider and was told to be seen at urgent care.

## 2021-12-19 NOTE — ED Provider Notes (Signed)
[facility]    CSN: [id number] Arrival date & time: 12/19/21  4967      History   Chief Complaint Chief Complaint  Patient presents with   Dysuria    Pain with urination and urinary urgency. X1 week    HPI Todd Huff is a 61 y.o. male.   HPI 61 year old male presents with dysuria, urinary frequency, and urinary urgency for 1 week.  PMH significant for BPH and history of prostatitis.  Patient reports unable to get in with PCP or urology.  Advised patient that urinalysis was completely clear.  Past Medical History:  Diagnosis Date   ALLERGIC RHINITIS 06/23/2008   Anxiety    BENIGN PROSTATIC HYPERTROPHY, HX OF 07/08/2008   Blood in stool    noted 12-22 in stool and toilet paper-? hemorrhoid- BRB   Blood transfusion without reported diagnosis    Chronic renal insufficiency, stage 2 (mild)    GFR low 60s   Hypercholesterolemia 09/2018   Recommended statin 09/2018; pt elected to do TLC   Hypertension    Increased prostate specific antigen (PSA) velocity 2019   10/13/18 urol f/u-->surveillance chosen over bx-->f/u urol/rpt PSA 6 mo.   Insomnia    LOW BACK PAIN 06/25/2010   NEPHROLITHIASIS, HX OF 06/23/2008   Palpitations 03/2020   EKGs with PVCs-->cardiol saw him and said no further w/u necessary.   Psoriasis    methotrexat at one point but changed to home UV light therapy qod and doing well (as of 03/2020)   Vitiligo     Patient Active Problem List   Diagnosis Date Noted   Nonspecific abnormal electrocardiogram (ECG) (EKG) 06/16/2020   PVC (premature ventricular contraction) 06/16/2020   History of colonic polyps 02/24/2015   LOW BACK PAIN 06/25/2010   GANGLION CYST, WRIST, LEFT 01/04/2010   BENIGN PROSTATIC HYPERTROPHY, HX OF 07/08/2008   ALLERGIC RHINITIS 06/23/2008   NEPHROLITHIASIS, HX OF 06/23/2008    Past Surgical History:  Procedure Laterality Date   COLONOSCOPY  2011;2017   2011 adenomatous polyp.  12/29/15 hyperplastic polyps.  Recall 10 yrs.    GANGLION CYST EXCISION Left    wrist    SPERMATOCELECTOMY         Home Medications    Prior to Admission medications   Medication Sig Start Date End Date Taking? Authorizing Provider  ALPRAZolam (XANAX) 0.25 MG tablet TAKE 1 TABLET BY MOUTH AS NEEDED AS DIRECTED 07/01/19  Yes McGowen, Adrian Blackwater, MD  atorvastatin (LIPITOR) 20 MG tablet TAKE 1 TABLET(20 MG) BY MOUTH DAILY 05/21/21  Yes McGowen, Adrian Blackwater, MD  lisinopril (ZESTRIL) 10 MG tablet TAKE 1 TABLET(10 MG) BY MOUTH DAILY 05/21/21  Yes McGowen, Adrian Blackwater, MD  ondansetron (ZOFRAN) 4 MG tablet 1-2 tabs po tid prn nausea 01/26/19  Yes McGowen, Adrian Blackwater, MD  propranolol (INDERAL) 10 MG tablet Take 1 tablet (10 mg total) by mouth as needed. 1 tab po bid prn 02/18/20  Yes McGowen, Adrian Blackwater, MD  sulfamethoxazole-trimethoprim (BACTRIM DS) 800-160 MG tablet Take 1 tablet by mouth 2 (two) times daily for 10 days. 12/19/21 12/29/21 Yes Eliezer Lofts, FNP  tamsulosin (FLOMAX) 0.4 MG CAPS capsule Take 0.4 mg by mouth daily. 05/03/21  Yes [provider]    Family History Family History  Problem Relation Age of Onset   Colon polyps Father    Colitis Mother    Diverticulosis Mother    Colon polyps Mother    Colon cancer Neg Hx    Esophageal cancer Neg  Hx    Rectal cancer Neg Hx    Stomach cancer Neg Hx     Social History Social History   Tobacco Use   Smoking status: Never   Smokeless tobacco: Never  Substance Use Topics   Alcohol use: No    Alcohol/week: 0.0 standard drinks   Drug use: No     Allergies   Hydrocodone and Codeine   Review of Systems Review of Systems  Genitourinary:  Positive for dysuria, frequency and urgency.  All other systems reviewed and are negative.   Physical Exam Triage Vital Signs ED Triage Vitals  Enc Vitals Group     BP 12/19/21 0856 125/79     Pulse Rate 12/19/21 0856 76     Resp 12/19/21 0856 18     Temp 12/19/21 0856 97.8 F (36.6 C)     Temp Source 12/19/21 0856 Oral     SpO2 12/19/21  0856 96 %     Weight 12/19/21 0854 205 lb (93 kg)     Height 12/19/21 0854 5\' 10"  (1.778 m)     Head Circumference --      Peak Flow --      Pain Score 12/19/21 0854 3     Pain Loc --      Pain Edu? --      Excl. in [place]? --    No data found.  Updated Vital Signs BP 125/79 (BP Location: Left Arm)    Pulse 76    Temp 97.8 F (36.6 C) (Oral)    Resp 18    Ht 5\' 10"  (1.778 m)    Wt 205 lb (93 kg)    SpO2 96%    BMI 29.41 kg/m      Physical Exam Vitals and nursing note reviewed.  Constitutional:      General: He is not in acute distress.    Appearance: Normal appearance. He is obese.  HENT:     Mouth/Throat:     Mouth: Mucous membranes are moist.     Pharynx: Oropharynx is clear.  Eyes:     Extraocular Movements: Extraocular movements intact.     Conjunctiva/sclera: Conjunctivae normal.     Pupils: Pupils are equal, round, and reactive to light.  Cardiovascular:     Rate and Rhythm: Normal rate and regular rhythm.     Pulses: Normal pulses.     Heart sounds: Normal heart sounds.  Pulmonary:     Effort: Pulmonary effort is normal.     Breath sounds: Normal breath sounds.  Abdominal:     Tenderness: There is no right CVA tenderness or left CVA tenderness.  Musculoskeletal:        General: Normal range of motion.     Cervical back: Normal range of motion and neck supple.  Skin:    General: Skin is warm and dry.  Neurological:     General: No focal deficit present.     Mental Status: He is alert and oriented to person, place, and time. Mental status is at baseline.     UC Treatments / Results  Labs (all labs ordered are listed, but only abnormal results are displayed) Labs Reviewed  POCT URINALYSIS DIP (MANUAL ENTRY) - Normal    EKG   Radiology No results found.  Procedures Procedures (including critical care time)  Medications Ordered in UC Medications - No data to display  Initial Impression / Assessment and Plan / UC Course  I have reviewed the triage  vital signs  and the nursing notes.  Pertinent labs & imaging results that were available during my care of the patient were reviewed by me and considered in my medical decision making (see chart for details).     MDM: 1.  Urinary frequency-UA was unremarkable unable to order urinary culture because of this; 2.  Prostatitis-unspecified prostatitis will treat empirically with Bactrim for 10 days.  Advised patient to follow-up with PCP or urologist if symptoms worsen and/or unresolved.  Patient discharged home, hemodynamically stable. Final Clinical Impressions(s) / UC Diagnoses   Final diagnoses:  Urinary frequency  Prostatitis, unspecified prostatitis type     Discharge Instructions      Advised patient urinalysis was completely negative/unremarkable and unable to obtain urinary culture because of this.  Advised patient we will treat empirically for prostatitis with Bactrim for the next 10 days.  Advised patient if symptoms worsen and/or unresolved please follow-up with PCP or Urology for further evaluation.  Patient to take medication as directed with food to completion.  Encouraged patient to increase daily water intake while taking this medication.     ED Prescriptions     Medication Sig Dispense Auth. Provider   sulfamethoxazole-trimethoprim (BACTRIM DS) 800-160 MG tablet Take 1 tablet by mouth 2 (two) times daily for 10 days. 20 tablet Eliezer Lofts, FNP      PDMP not reviewed this encounter.   Eliezer Lofts, [place] 12/19/21 [phone]

## 2021-12-19 NOTE — Progress Notes (Signed)
Telemedicine Encounter- SOAP NOTE Established Patient  This video encounter was conducted with the patient's (or proxy's) verbal consent via audio telecommunications: yes/no: Yes Patient was instructed to have this encounter in a suitably private space; and to only have persons present to whom they give permission to participate. In addition, patient identity was confirmed by use of name plus two identifiers (DOB and address).  I discussed the limitations, risks, security and privacy concerns of performing an evaluation and management service by telephone and the availability of in person appointments. I also discussed with the patient that there may be a patient responsible charge related to this service. The patient expressed understanding and agreed to proceed.  I spent a total of 14 minutes talking with the patient or their proxy.  Patient at home Provider in office  Participants: Kathrin Ruddy, NP and [name]  Chief Complaint  Patient presents with   Rash    Patient states he has a rash at waistline and goes down to the top of his legs and patient thinks he has shingles since this afternoon. He also thinks its Hemorrhoids as well./     Subjective   Todd Huff is a 61 y.o. established patient. Video visit today for rash  HPI Started with pain last Friday near rectum / rear. Felt more prostate. Then Sunday/Monday noted some pain near the rectum he assumed to be hemorrhoids.  Did have some trouble urinating - seen at Urgent Care this morning - given bactrim for prostatitis/UTI - but has not taken this yet - fortunately got in to see his urologist who advised against this.  However, provider there noted more redness than expected with hemorrhoids and more widespread.  Noted today afterwards that there is a blistered rash starting at belt line and going down L of center. Burning pain. Rash extends into intragluteal fold.    Patient Active Problem List   Diagnosis Date Noted    Nonspecific abnormal electrocardiogram (ECG) (EKG) 06/16/2020   PVC (premature ventricular contraction) 06/16/2020   History of colonic polyps 02/24/2015   LOW BACK PAIN 06/25/2010   GANGLION CYST, WRIST, LEFT 01/04/2010   BENIGN PROSTATIC HYPERTROPHY, HX OF 07/08/2008   ALLERGIC RHINITIS 06/23/2008   NEPHROLITHIASIS, HX OF 06/23/2008    Past Medical History:  Diagnosis Date   ALLERGIC RHINITIS 06/23/2008   Anxiety    BENIGN PROSTATIC HYPERTROPHY, HX OF 07/08/2008   Blood in stool    noted 12-22 in stool and toilet paper-? hemorrhoid- BRB   Blood transfusion without reported diagnosis    Chronic renal insufficiency, stage 2 (mild)    GFR low 60s   Hypercholesterolemia 09/2018   Recommended statin 09/2018; pt elected to do TLC   Hypertension    Increased prostate specific antigen (PSA) velocity 2019   10/13/18 urol f/u-->surveillance chosen over bx-->f/u urol/rpt PSA 6 mo.   Insomnia    LOW BACK PAIN 06/25/2010   NEPHROLITHIASIS, HX OF 06/23/2008   Palpitations 03/2020   EKGs with PVCs-->cardiol saw him and said no further w/u necessary.   Psoriasis    methotrexat at one point but changed to home UV light therapy qod and doing well (as of 03/2020)   Vitiligo     Current Outpatient Medications  Medication Sig Dispense Refill   ALPRAZolam (XANAX) 0.25 MG tablet TAKE 1 TABLET BY MOUTH AS NEEDED AS DIRECTED 60 tablet 5   atorvastatin (LIPITOR) 20 MG tablet TAKE 1 TABLET(20 MG) BY MOUTH DAILY 90 tablet 3  lisinopril (ZESTRIL) 10 MG tablet TAKE 1 TABLET(10 MG) BY MOUTH DAILY 90 tablet 3   ondansetron (ZOFRAN) 4 MG tablet 1-2 tabs po tid prn nausea 30 tablet 1   propranolol (INDERAL) 10 MG tablet Take 1 tablet (10 mg total) by mouth as needed. 1 tab po bid prn 60 tablet 5   sulfamethoxazole-trimethoprim (BACTRIM DS) 800-160 MG tablet Take 1 tablet by mouth 2 (two) times daily for 10 days. 20 tablet 0   tamsulosin (FLOMAX) 0.4 MG CAPS capsule Take 0.4 mg by mouth daily.     No  current facility-administered medications for this visit.    Allergies  Allergen Reactions   Hydrocodone Nausea Only   Codeine Nausea And Vomiting    Social History   Socioeconomic History   Marital status: Married    Spouse name: Not on file   Number of children: Not on file   Years of education: Not on file   Highest education level: Not on file  Occupational History   Not on file  Tobacco Use   Smoking status: Never   Smokeless tobacco: Never  Substance and Sexual Activity   Alcohol use: No    Alcohol/week: 0.0 standard drinks   Drug use: No   Sexual activity: Not on file  Other Topics Concern   Not on file  Social History Narrative   Married, 2 grown sons 2 adult daughters.   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   Orig from [place], [place] since his 19s.   No tobacco.   No alcohol.   Social Determinants of Health   Financial Resource Strain: Not on file  Food Insecurity: Not on file  Transportation Needs: Not on file  Physical Activity: Not on file  Stress: Not on file  Social Connections: Not on file  Intimate Partner Violence: Not on file    Review of Systems  Constitutional: Negative.   HENT: Negative.    Eyes: Negative.   Respiratory: Negative.    Cardiovascular: Negative.   Gastrointestinal: Negative.   Genitourinary: Negative.   Musculoskeletal: Negative.   Skin: Negative.   Neurological: Negative.   Endo/Heme/Allergies: Negative.   Psychiatric/Behavioral: Negative.    All other systems reviewed and are negative.  Objective   Vitals as reported by the patient: There were no vitals filed for this visit.  There are no diagnoses linked to this encounter.  PLAN Suspect shingles. Will treat with antivirals and gabapentin as above Reviewed risks, benefits, AE of treatment plan. Reviewed disease process and reasons to return Pt voices understanding of above Patient encouraged to call clinic with any questions, comments, or concerns.  I  discussed the assessment and treatment plan with the patient. The patient was provided an opportunity to ask questions and all were answered. The patient agreed with the plan and demonstrated an understanding of the instructions.   The patient was advised to call back or seek an in-person evaluation if the symptoms worsen or if the condition fails to improve as anticipated.  I provided 14 minutes of face-to-face time during this encounter.  Maximiano Coss, NP

## 2021-12-26 DIAGNOSIS — N401 Enlarged prostate with lower urinary tract symptoms: Secondary | ICD-10-CM | POA: Diagnosis not present

## 2021-12-26 DIAGNOSIS — R300 Dysuria: Secondary | ICD-10-CM | POA: Diagnosis not present

## 2022-01-02 DIAGNOSIS — R972 Elevated prostate specific antigen [PSA]: Secondary | ICD-10-CM | POA: Diagnosis not present

## 2022-01-02 DIAGNOSIS — R3915 Urgency of urination: Secondary | ICD-10-CM | POA: Diagnosis not present

## 2022-01-02 DIAGNOSIS — N401 Enlarged prostate with lower urinary tract symptoms: Secondary | ICD-10-CM | POA: Diagnosis not present

## 2022-02-05 DIAGNOSIS — L80 Vitiligo: Secondary | ICD-10-CM | POA: Diagnosis not present

## 2022-02-05 DIAGNOSIS — Z85828 Personal history of other malignant neoplasm of skin: Secondary | ICD-10-CM | POA: Diagnosis not present

## 2022-02-05 DIAGNOSIS — L400 Psoriasis vulgaris: Secondary | ICD-10-CM | POA: Diagnosis not present

## 2022-02-05 DIAGNOSIS — D225 Melanocytic nevi of trunk: Secondary | ICD-10-CM | POA: Diagnosis not present

## 2022-05-20 ENCOUNTER — Ambulatory Visit (INDEPENDENT_AMBULATORY_CARE_PROVIDER_SITE_OTHER): Payer: 59 | Admitting: Family Medicine

## 2022-05-20 ENCOUNTER — Encounter: Payer: Self-pay | Admitting: Family Medicine

## 2022-05-20 VITALS — BP 112/73 | HR 59 | Temp 97.7°F | Ht 71.75 in | Wt 209.4 lb

## 2022-05-20 DIAGNOSIS — F418 Other specified anxiety disorders: Secondary | ICD-10-CM

## 2022-05-20 DIAGNOSIS — Z79899 Other long term (current) drug therapy: Secondary | ICD-10-CM | POA: Diagnosis not present

## 2022-05-20 DIAGNOSIS — I10 Essential (primary) hypertension: Secondary | ICD-10-CM

## 2022-05-20 DIAGNOSIS — E7800 Pure hypercholesterolemia, unspecified: Secondary | ICD-10-CM

## 2022-05-20 LAB — LIPID PANEL
Cholesterol: 120 mg/dL (ref 0–200)
HDL: 38.6 mg/dL — ABNORMAL LOW (ref >39.00)
LDL Cholesterol: 63 mg/dL (ref 0–99)
NonHDL: 81.38
Total CHOL/HDL Ratio: 3
Triglycerides: 91 mg/dL (ref 0.0–149.0)
VLDL: 18.2 mg/dL (ref 0.0–40.0)

## 2022-05-20 LAB — COMPREHENSIVE METABOLIC PANEL
ALT: 21 U/L (ref 0–53)
AST: 18 U/L (ref 0–37)
Albumin: 4.1 g/dL (ref 3.5–5.2)
Alkaline Phosphatase: 74 U/L (ref 39–117)
BUN: 25 mg/dL — ABNORMAL HIGH (ref 6–23)
CO2: 28 mEq/L (ref 19–32)
Calcium: 9.2 mg/dL (ref 8.4–10.5)
Chloride: 105 mEq/L (ref 96–112)
Creatinine, Ser: 1.24 mg/dL (ref 0.40–1.50)
GFR: 62.32 mL/min (ref >60.00)
Glucose, Bld: 95 mg/dL (ref 70–99)
Potassium: 4.1 mEq/L (ref 3.5–5.1)
Sodium: 140 mEq/L (ref 135–145)
Total Bilirubin: 0.9 mg/dL (ref 0.2–1.2)
Total Protein: 6.2 g/dL (ref 6.0–8.3)

## 2022-05-20 MED ORDER — PROPRANOLOL HCL 10 MG PO TABS: 10.0000 mg | ORAL_TABLET | ORAL | 1 refills | Status: DC | PRN

## 2022-05-20 MED ORDER — ATORVASTATIN CALCIUM 20 MG PO TABS: ORAL_TABLET | ORAL | 1 refills | Status: DC

## 2022-05-20 MED ORDER — ALPRAZOLAM 0.25 MG PO TABS: ORAL_TABLET | ORAL | 0 refills | Status: DC

## 2022-05-20 MED ORDER — LISINOPRIL 10 MG PO TABS: ORAL_TABLET | ORAL | 1 refills | Status: DC

## 2022-05-20 NOTE — Progress Notes (Signed)
OFFICE VISIT  05/20/2022  CC:  Chief Complaint  Patient presents with   Annual Exam    Pt is fasting   Patient is a 61 y.o. male who presents for 6 mo f/u HTN, HLD, and CRI II/III. All was stable at last f/u 6 mo ago.  INTERIM HX: Todd Huff feels well and is doing good.  He walks daily for exercise. Diet fairly healthy.  Compliant with medications.  He uses the alprazolam and propranolol only occasionally for his anxiety but it works well.  PMP AWARE reviewed today: NOTHING LISTED.  ROS as above, plus--> no fevers, no CP, no SOB, no wheezing, no cough, no dizziness, no HAs, no rashes, no melena/hematochezia.  No polyuria or polydipsia.  No myalgias or arthralgias.  No focal weakness, paresthesias, or tremors.  No acute vision or hearing abnormalities.  No dysuria or unusual/new urinary urgency or frequency.  No recent changes in lower legs. No n/v/d or abd pain.  No palpitations.    Past Medical History:  Diagnosis Date   ALLERGIC RHINITIS 06/23/2008   Anxiety    BENIGN PROSTATIC HYPERTROPHY, HX OF 07/08/2008   Blood in stool    noted 12-22 in stool and toilet paper-? hemorrhoid- BRB   Blood transfusion without reported diagnosis    Chronic renal insufficiency, stage 2 (mild)    GFR low 60s   Hypercholesterolemia 09/2018   Recommended statin 09/2018; pt elected to do TLC   Hypertension    Increased prostate specific antigen (PSA) velocity 2019   10/13/18 urol f/u-->surveillance chosen over bx-->f/u urol/rpt PSA 6 mo.   Insomnia    LOW BACK PAIN 06/25/2010   NEPHROLITHIASIS, HX OF 06/23/2008   Palpitations 03/2020   EKGs with PVCs-->cardiol saw him and said no further w/u necessary.   Psoriasis    methotrexat at one point but changed to home UV light therapy qod and doing well (as of 03/2020)   Vitiligo     Past Surgical History:  Procedure Laterality Date   COLONOSCOPY  2011;2017   2011 adenomatous polyp.  12/29/15 hyperplastic polyps.  Recall 10 yrs.   GANGLION CYST EXCISION  Left    wrist    SPERMATOCELECTOMY      Outpatient Medications Prior to Visit  Medication Sig Dispense Refill   gabapentin (NEURONTIN) 100 MG capsule Take 1 capsule (100 mg total) by mouth 3 (three) times daily. Can increase to '300mg'$  before bed if needed. 90 capsule 3   tamsulosin (FLOMAX) 0.4 MG CAPS capsule Take 0.4 mg by mouth daily.     ALPRAZolam (XANAX) 0.25 MG tablet TAKE 1 TABLET BY MOUTH AS NEEDED AS DIRECTED 60 tablet 5   ondansetron (ZOFRAN) 4 MG tablet 1-2 tabs po tid prn nausea (Patient not taking: Reported on 05/20/2022) 30 tablet 1   atorvastatin (LIPITOR) 20 MG tablet TAKE 1 TABLET(20 MG) BY MOUTH DAILY 90 tablet 3   lisinopril (ZESTRIL) 10 MG tablet TAKE 1 TABLET(10 MG) BY MOUTH DAILY 90 tablet 3   propranolol (INDERAL) 10 MG tablet Take 1 tablet (10 mg total) by mouth as needed. 1 tab po bid prn (Patient not taking: Reported on 05/20/2022) 60 tablet 5   No facility-administered medications prior to visit.    Allergies  Allergen Reactions   Hydrocodone Nausea Only   Codeine Nausea And Vomiting    ROS As per HPI  PE:    05/20/2022    8:06 AM 12/19/2021    8:56 AM 12/19/2021    8:54 AM  Vitals with BMI  Height 5' 11.75"  '5\' 10"'$   Weight 209 lbs 6 oz  205 lbs  BMI 22.29  79.89  Systolic 211 941   Diastolic 73 79   Pulse 59 76      Physical Exam  Gen: Alert, well appearing.  Patient is oriented to person, place, time, and situation. AFFECT: pleasant, lucid thought and speech. CV: RRR, no m/r/g.   LUNGS: CTA bilat, nonlabored resps, good aeration in all lung fields. EXT: no clubbing or cyanosis.  no edema.   LABS:  Last CBC Lab Results  Component Value Date   WBC 4.7 11/19/2021   HGB 15.5 11/19/2021   HCT 46.9 11/19/2021   MCV 88.4 11/19/2021   RDW 13.4 11/19/2021   PLT 178.0 74/07/1447   Last metabolic panel Lab Results  Component Value Date   GLUCOSE 91 11/19/2021   NA 140 11/19/2021   K 4.1 11/19/2021   CL 103 11/19/2021   CO2 30 11/19/2021    BUN 24 (H) 11/19/2021   CREATININE 1.35 11/19/2021   CALCIUM 9.3 11/19/2021   PROT 6.5 11/19/2021   ALBUMIN 4.4 11/19/2021   BILITOT 1.0 11/19/2021   ALKPHOS 78 11/19/2021   AST 20 11/19/2021   ALT 24 11/19/2021   Last lipids Lab Results  Component Value Date   CHOL 135 11/19/2021   HDL 42.60 11/19/2021   LDLCALC 73 11/19/2021   TRIG 96.0 11/19/2021   CHOLHDL 3 11/19/2021   Last thyroid functions Lab Results  Component Value Date   TSH 1.73 11/19/2021   IMPRESSION AND PLAN:  #1 hypertension, well controlled on lisinopril 10 mg a day. Electrolytes and creatinine today.  2.  Hyperlipidemia, doing well on atorvastatin 20 mg a day. Lipid and hepatic panels today.  #3 episodic/situational anxiety.  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 Refill alprazolam 0.25 mg, 1 twice daily as needed, #45, no refill. Refilled propranolol 10 mg, 1 twice daily as needed, #45, no refill.  An After Visit Summary was printed and given to the patient.  FOLLOW UP: Return in about 6 months (around 11/19/2022) for annual CPE (fasting).  Signed:  Crissie Sickles, MD           05/20/2022

## 2022-05-21 ENCOUNTER — Other Ambulatory Visit: Payer: Self-pay

## 2022-05-21 NOTE — Telephone Encounter (Signed)
Pharmacy needs to know frequency of how often pt takes med for day supply, Unable to accept  previous rx sig directions of 1 tablet by mouth as needed as directed.   Please review and advise

## 2022-05-21 NOTE — Telephone Encounter (Signed)
Medication clarification needed for sig directions. Pt was last seen 05/20/22  Please review and sign rx order.

## 2022-05-21 NOTE — Telephone Encounter (Signed)
What exactly needs clarifying??

## 2022-05-22 MED ORDER — ALPRAZOLAM 0.25 MG PO TABS: ORAL_TABLET | ORAL | 0 refills | Status: DC

## 2022-08-16 ENCOUNTER — Ambulatory Visit
Admission: EM | Admit: 2022-08-16 | Discharge: 2022-08-16 | Disposition: A | Discharge status: Home / Self Care | Payer: 59 | Attending: Family Medicine | Admitting: Family Medicine

## 2022-08-16 ENCOUNTER — Ambulatory Visit (INDEPENDENT_AMBULATORY_CARE_PROVIDER_SITE_OTHER): Payer: 59

## 2022-08-16 DIAGNOSIS — R059 Cough, unspecified: Secondary | ICD-10-CM | POA: Diagnosis not present

## 2022-08-16 DIAGNOSIS — R053 Chronic cough: Secondary | ICD-10-CM

## 2022-08-16 MED ORDER — PREDNISONE 20 MG PO TABS: ORAL_TABLET | ORAL | 0 refills | Status: DC

## 2022-08-16 MED ORDER — AZITHROMYCIN 250 MG PO TABS: ORAL_TABLET | ORAL | 0 refills | Status: DC

## 2022-08-16 NOTE — ED Triage Notes (Signed)
Pt c/o dry cough x 3 weeks. Was taking Mucinex but d/c a week ago.

## 2022-08-16 NOTE — Discharge Instructions (Signed)
Take plain guaifenesin ('1200mg'$  extended release tabs such as Mucinex) twice daily, with plenty of water, for cough and congestion.   If cough becomes worse at night, may take Delsym Cough Suppressant ("12 Hour Cough Relief") at bedtime.

## 2022-08-16 NOTE — ED Provider Notes (Signed)
[facility]    CSN: [id number] Arrival date & time: 08/16/22  1041      History   Chief Complaint Chief Complaint  Patient presents with   Cough    X 3 weeks    HPI Todd Huff is a 61 y.o. male.   Patient developed a non-productive cough three weeks ago without other symptoms and feels well otherwise.  He denies pleuritic pain and shortness of breath.  He often coughs until he gags.  He denies GERD symptoms.  He has had no improvement while taking Mucinex DM.  The history is provided by the patient.    Past Medical History:  Diagnosis Date   ALLERGIC RHINITIS 06/23/2008   Anxiety    BENIGN PROSTATIC HYPERTROPHY, HX OF 07/08/2008   Blood in stool    noted 12-22 in stool and toilet paper-? hemorrhoid- BRB   Blood transfusion without reported diagnosis    Chronic renal insufficiency, stage 2 (mild)    GFR low 60s   Hypercholesterolemia 09/2018   Recommended statin 09/2018; pt elected to do TLC   Hypertension    Increased prostate specific antigen (PSA) velocity 2019   10/13/18 urol f/u-->surveillance chosen over bx-->f/u urol/rpt PSA 6 mo.   Insomnia    LOW BACK PAIN 06/25/2010   NEPHROLITHIASIS, HX OF 06/23/2008   Palpitations 03/2020   EKGs with PVCs-->cardiol saw him and said no further w/u necessary.   Psoriasis    methotrexat at one point but changed to home UV light therapy qod and doing well (as of 03/2020)   Vitiligo     Patient Active Problem List   Diagnosis Date Noted   Nonspecific abnormal electrocardiogram (ECG) (EKG) 06/16/2020   PVC (premature ventricular contraction) 06/16/2020   History of colonic polyps 02/24/2015   LOW BACK PAIN 06/25/2010   GANGLION CYST, WRIST, LEFT 01/04/2010   BENIGN PROSTATIC HYPERTROPHY, HX OF 07/08/2008   ALLERGIC RHINITIS 06/23/2008   NEPHROLITHIASIS, HX OF 06/23/2008    Past Surgical History:  Procedure Laterality Date   COLONOSCOPY  2011;2017   2011 adenomatous polyp.  12/29/15 hyperplastic polyps.  Recall  10 yrs.   GANGLION CYST EXCISION Left    wrist    SPERMATOCELECTOMY         Home Medications    Prior to Admission medications   Medication Sig Start Date End Date Taking? Authorizing Provider  azithromycin (ZITHROMAX Z-PAK) 250 MG tablet Take 2 tabs today; then begin one tab once daily for 4 more days. 08/16/22  Yes Kandra Nicolas, MD  predniSONE (DELTASONE) 20 MG tablet Take one tab by mouth twice daily for 4 days, then one daily for 3 days. Take with food. 08/16/22  Yes Kandra Nicolas, MD  ALPRAZolam [name]) 0.25 MG tablet TAKE 1 TABLET BY MOUTH 1 time per day AS NEEDED. 05/22/22   McGowen, Adrian Blackwater, MD  atorvastatin (LIPITOR) 20 MG tablet TAKE 1 TABLET(20 MG) BY MOUTH DAILY 05/20/22   McGowen, Adrian Blackwater, MD  gabapentin (NEURONTIN) 100 MG capsule Take 1 capsule (100 mg total) by mouth 3 (three) times daily. Can increase to '300mg'$  before bed if needed. 12/19/21   Maximiano Coss, NP  lisinopril (ZESTRIL) 10 MG tablet TAKE 1 TABLET(10 MG) BY MOUTH DAILY 05/20/22   McGowen, Adrian Blackwater, MD  ondansetron (ZOFRAN) 4 MG tablet 1-2 tabs po tid prn nausea Patient not taking: Reported on 05/20/2022 01/26/19   Tammi Sou, MD  propranolol (INDERAL) 10 MG tablet Take 1 tablet (10  mg total) by mouth as needed. 1 tab po bid prn 05/20/22   McGowen, Adrian Blackwater, MD  tamsulosin (FLOMAX) 0.4 MG CAPS capsule Take 0.4 mg by mouth daily. 05/03/21   [provider]    Family History Family History  Problem Relation Age of Onset   Colon polyps Father    Colitis Mother    Diverticulosis Mother    Colon polyps Mother    Colon cancer Neg Hx    Esophageal cancer Neg Hx    Rectal cancer Neg Hx    Stomach cancer Neg Hx     Social History Social History   Tobacco Use   Smoking status: Never   Smokeless tobacco: Never  Substance Use Topics   Alcohol use: No    Alcohol/week: 0.0 standard drinks of alcohol   Drug use: No     Allergies   Hydrocodone and Codeine   Review of Systems Review of  SystemsNo sore throat + cough No pleuritic pain No wheezing No nasal congestion No post-nasal drainage No sinus pain/pressure No itchy/red eyes No earache No hemoptysis No SOB No fever/chills No nausea No vomiting No abdominal pain No diarrhea No urinary symptoms No skin rash No fatigue No myalgias No headache    Physical Exam Triage Vital Signs ED Triage Vitals  Enc Vitals Group     BP 08/16/22 1126 122/77     Pulse Rate 08/16/22 1126 62     Resp 08/16/22 1126 17     Temp 08/16/22 1126 98.4 F (36.9 C)     Temp Source 08/16/22 1126 Oral     SpO2 08/16/22 1126 96 %     Weight --      Height --      Head Circumference --      Peak Flow --      Pain Score 08/16/22 1127 0     Pain Loc --      Pain Edu? --      Excl. in [place]? --    No data found.  Updated Vital Signs BP 122/77 (BP Location: Right Arm)   Pulse 62   Temp 98.4 F (36.9 C) (Oral)   Resp 17   SpO2 96%   Visual Acuity Right Eye Distance:   Left Eye Distance:   Bilateral Distance:    Right Eye Near:   Left Eye Near:    Bilateral Near:     Physical Exam Nursing notes and Vital Signs reviewed. Appearance:  Patient appears stated age, and in no acute distress Eyes:  Pupils are equal, round, and reactive to light and accomodation.  Extraocular movement is intact.  Conjunctivae are not inflamed  Ears:  Canals normal.  Tympanic membranes normal.  Nose:   Normal turbinates.  No sinus tenderness.  Pharynx:  Normal Neck:  Supple.  No adenopathy. Lungs:  Clear to auscultation.  Breath sounds are equal.  Moving air well. Heart:  Regular rate and rhythm without murmurs, rubs, or gallops.  Abdomen:  Nontender without masses or hepatosplenomegaly.  Bowel sounds are present.  No CVA or flank tenderness.  Extremities:  No edema.  Skin:  No rash present.   UC Treatments / Results  Labs (all labs ordered are listed, but only abnormal results are displayed) Labs Reviewed - No data to  display  EKG   Radiology DG Chest 2 View  Result Date: 08/16/2022 CLINICAL DATA:  Cough EXAM: CHEST - 2 VIEW COMPARISON:  Chest x-ray dated August 25, 2006 FINDINGS:  The heart size and mediastinal contours are within normal limits. Both lungs are clear. The visualized skeletal structures are unremarkable. IMPRESSION: No active cardiopulmonary disease. Electronically Signed   By: Yetta Glassman M.D.   On: 08/16/2022 12:35    Procedures Procedures (including critical care time)  Medications Ordered in UC Medications - No data to display  Initial Impression / Assessment and Plan / UC Course  I have reviewed the triage vital signs and the nursing notes.  Pertinent labs & imaging results that were available during my care of the patient were reviewed by me and considered in my medical decision making (see chart for details).    Begin Z-pack for atypical coverage and prednisone burst/taper. Followup with Family Doctor if not improved in about 10 days.  Final Clinical Impressions(s) / UC Diagnoses   Final diagnoses:  Persistent cough for 3 weeks or longer     Discharge Instructions      Take plain guaifenesin ('1200mg'$  extended release tabs such as Mucinex) twice daily, with plenty of water, for cough and congestion.   If cough becomes worse at night, may take Delsym Cough Suppressant ("12 Hour Cough Relief") at bedtime.     ED Prescriptions     Medication Sig Dispense Auth. Provider   azithromycin (ZITHROMAX Z-PAK) 250 MG tablet Take 2 tabs today; then begin one tab once daily for 4 more days. 6 tablet Kandra Nicolas, MD   predniSONE (DELTASONE) 20 MG tablet Take one tab by mouth twice daily for 4 days, then one daily for 3 days. Take with food. 11 tablet Kandra Nicolas, MD         Kandra Nicolas, MD 08/18/22 1150

## 2022-11-21 NOTE — Patient Instructions (Signed)
Health Maintenance, Male Adopting a healthy lifestyle and getting preventive care are important in promoting health and wellness. Ask your health care provider about: The right schedule for you to have regular tests and exams. Things you can do on your own to prevent diseases and keep yourself healthy. What should I know about diet, weight, and exercise? Eat a healthy diet  Eat a diet that includes plenty of vegetables, fruits, low-fat dairy products, and lean protein. Do not eat a lot of foods that are high in solid fats, added sugars, or sodium. Maintain a healthy weight Body mass index (BMI) is a measurement that can be used to identify possible weight problems. It estimates body fat based on height and weight. Your health care provider can help determine your BMI and help you achieve or maintain a healthy weight. Get regular exercise Get regular exercise. This is one of the most important things you can do for your health. Most adults should: Exercise for at least 150 minutes each week. The exercise should increase your heart rate and make you sweat (moderate-intensity exercise). Do strengthening exercises at least twice a week. This is in addition to the moderate-intensity exercise. Spend less time sitting. Even light physical activity can be beneficial. Watch cholesterol and blood lipids Have your blood tested for lipids and cholesterol at 63 years of age, then have this test every 5 years. You may need to have your cholesterol levels checked more often if: Your lipid or cholesterol levels are high. You are older than 63 years of age. You are at high risk for heart disease. What should I know about cancer screening? Many types of cancers can be detected early and may often be prevented. Depending on your health history and family history, you may need to have cancer screening at various ages. This may include screening for: Colorectal cancer. Prostate cancer. Skin cancer. Lung  cancer. What should I know about heart disease, diabetes, and high blood pressure? Blood pressure and heart disease High blood pressure causes heart disease and increases the risk of stroke. This is more likely to develop in people who have high blood pressure readings or are overweight. Talk with your health care provider about your target blood pressure readings. Have your blood pressure checked: Every 3-5 years if you are 18-39 years of age. Every year if you are 40 years old or older. If you are between the ages of 65 and 75 and are a current or former smoker, ask your health care provider if you should have a one-time screening for abdominal aortic aneurysm (AAA). Diabetes Have regular diabetes screenings. This checks your fasting blood sugar level. Have the screening done: Once every three years after age 45 if you are at a normal weight and have a low risk for diabetes. More often and at a younger age if you are overweight or have a high risk for diabetes. What should I know about preventing infection? Hepatitis B If you have a higher risk for hepatitis B, you should be screened for this virus. Talk with your health care provider to find out if you are at risk for hepatitis B infection. Hepatitis C Blood testing is recommended for: Everyone born from 1945 through 1965. Anyone with known risk factors for hepatitis C. Sexually transmitted infections (STIs) You should be screened each year for STIs, including gonorrhea and chlamydia, if: You are sexually active and are younger than 63 years of age. You are older than 63 years of age and your   health care provider tells you that you are at risk for this type of infection. Your sexual activity has changed since you were last screened, and you are at increased risk for chlamydia or gonorrhea. Ask your health care provider if you are at risk. Ask your health care provider about whether you are at high risk for HIV. Your health care provider  may recommend a prescription medicine to help prevent HIV infection. If you choose to take medicine to prevent HIV, you should first get tested for HIV. You should then be tested every 3 months for as long as you are taking the medicine. Follow these instructions at home: Alcohol use Do not drink alcohol if your health care provider tells you not to drink. If you drink alcohol: Limit how much you have to 0-2 drinks a day. Know how much alcohol is in your drink. In the [place] one drink equals one 12 oz bottle of beer (355 mL), one 5 oz glass of wine (148 mL), or one 1 oz glass of hard liquor (44 mL). Lifestyle Do not use any products that contain nicotine or tobacco. These products include cigarettes, chewing tobacco, and vaping devices, such as e-cigarettes. If you need help quitting, ask your health care provider. Do not use street drugs. Do not share needles. Ask your health care provider for help if you need support or information about quitting drugs. General instructions Schedule regular health, dental, and eye exams. Stay current with your vaccines. Tell your health care provider if: You often feel depressed. You have ever been abused or do not feel safe at home. Summary Adopting a healthy lifestyle and getting preventive care are important in promoting health and wellness. Follow your health care provider's instructions about healthy diet, exercising, and getting tested or screened for diseases. Follow your health care provider's instructions on monitoring your cholesterol and blood pressure. This information is not intended to replace advice given to you by your health care provider. Make sure you discuss any questions you have with your health care provider. Document Revised: 04/23/2021 Document Reviewed: 04/23/2021 Elsevier Patient Education  2023 Elsevier Inc.  

## 2022-11-22 ENCOUNTER — Encounter: Payer: Self-pay | Admitting: Family Medicine

## 2022-11-22 ENCOUNTER — Other Ambulatory Visit: Payer: Self-pay

## 2022-11-22 ENCOUNTER — Ambulatory Visit (INDEPENDENT_AMBULATORY_CARE_PROVIDER_SITE_OTHER): Payer: 59 | Admitting: Family Medicine

## 2022-11-22 VITALS — BP 108/71 | HR 71 | Temp 98.2°F | Ht 71.75 in | Wt 215.6 lb

## 2022-11-22 DIAGNOSIS — Z0000 Encounter for general adult medical examination without abnormal findings: Secondary | ICD-10-CM

## 2022-11-22 DIAGNOSIS — F418 Other specified anxiety disorders: Secondary | ICD-10-CM

## 2022-11-22 DIAGNOSIS — I10 Essential (primary) hypertension: Secondary | ICD-10-CM

## 2022-11-22 DIAGNOSIS — Z125 Encounter for screening for malignant neoplasm of prostate: Secondary | ICD-10-CM

## 2022-11-22 DIAGNOSIS — J3489 Other specified disorders of nose and nasal sinuses: Secondary | ICD-10-CM

## 2022-11-22 DIAGNOSIS — E7800 Pure hypercholesterolemia, unspecified: Secondary | ICD-10-CM | POA: Diagnosis not present

## 2022-11-22 DIAGNOSIS — B029 Zoster without complications: Secondary | ICD-10-CM

## 2022-11-22 LAB — TSH: TSH: 1.6 u[IU]/mL (ref 0.35–5.50)

## 2022-11-22 LAB — LIPID PANEL
Cholesterol: 120 mg/dL (ref 0–200)
HDL: 36.7 mg/dL — ABNORMAL LOW (ref >39.00)
LDL Cholesterol: 66 mg/dL (ref 0–99)
NonHDL: 83.46
Total CHOL/HDL Ratio: 3
Triglycerides: 86 mg/dL (ref 0.0–149.0)
VLDL: 17.2 mg/dL (ref 0.0–40.0)

## 2022-11-22 LAB — CBC
HCT: 46.7 % (ref 39.0–52.0)
Hemoglobin: 15.8 g/dL (ref 13.0–17.0)
MCHC: 33.8 g/dL (ref 30.0–36.0)
MCV: 88.4 fl (ref 78.0–100.0)
Platelets: 198 10*3/uL (ref 150.0–400.0)
RBC: 5.29 Mil/uL (ref 4.22–5.81)
RDW: 13.7 % (ref 11.5–15.5)
WBC: 3.6 10*3/uL — ABNORMAL LOW (ref 4.0–10.5)

## 2022-11-22 LAB — COMPREHENSIVE METABOLIC PANEL
ALT: 27 U/L (ref 0–53)
AST: 22 U/L (ref 0–37)
Albumin: 4.2 g/dL (ref 3.5–5.2)
Alkaline Phosphatase: 80 U/L (ref 39–117)
BUN: 20 mg/dL (ref 6–23)
CO2: 32 mEq/L (ref 19–32)
Calcium: 9 mg/dL (ref 8.4–10.5)
Chloride: 104 mEq/L (ref 96–112)
Creatinine, Ser: 1.21 mg/dL (ref 0.40–1.50)
GFR: 63.95 mL/min (ref >60.00)
Glucose, Bld: 89 mg/dL (ref 70–99)
Potassium: 4.1 mEq/L (ref 3.5–5.1)
Sodium: 139 mEq/L (ref 135–145)
Total Bilirubin: 0.8 mg/dL (ref 0.2–1.2)
Total Protein: 6.3 g/dL (ref 6.0–8.3)

## 2022-11-22 LAB — PSA: PSA: 3.56 ng/mL (ref 0.10–4.00)

## 2022-11-22 MED ORDER — ATORVASTATIN CALCIUM 20 MG PO TABS
ORAL_TABLET | ORAL | 1 refills | Status: DC
Start: 2022-11-22 — End: 2023-05-23

## 2022-11-22 MED ORDER — GABAPENTIN 100 MG PO CAPS: 100.0000 mg | ORAL_CAPSULE | Freq: Three times a day (TID) | ORAL | 3 refills | Status: DC

## 2022-11-22 MED ORDER — LISINOPRIL 10 MG PO TABS
ORAL_TABLET | ORAL | 1 refills | Status: DC
Start: 2022-11-22 — End: 2023-05-23

## 2022-11-22 MED ORDER — PROPRANOLOL HCL 10 MG PO TABS: 10.0000 mg | ORAL_TABLET | ORAL | 1 refills | Status: DC | PRN

## 2022-11-22 MED ORDER — ALPRAZOLAM 0.25 MG PO TABS
ORAL_TABLET | ORAL | 1 refills | Status: DC
Start: 2022-11-22 — End: 2023-05-28

## 2022-11-22 MED ORDER — IPRATROPIUM BROMIDE 0.03 % NA SOLN: 2.0000 | Freq: Two times a day (BID) | NASAL | 12 refills | Status: DC

## 2022-11-22 MED ORDER — PROPRANOLOL HCL 10 MG PO TABS: 10.0000 mg | ORAL_TABLET | Freq: Two times a day (BID) | ORAL | 0 refills | Status: DC | PRN

## 2022-11-22 NOTE — Progress Notes (Signed)
Office Note 11/22/2022  CC:  Chief Complaint  Patient presents with   Annual Exam    Pt is fasting    HPI:  Patient is a 61 y.o. male who is here for annual health maintenance exam and 48-monthfollow-up hypertension and hypercholesterolemia.  He feels well other than having chronic runny nose the last 6 weeks or so.  He feels like he is having a reaction to something in his office.  When attorney he is on every winter he starts having this. He has been to the allergist and was told he has no allergies. He uses Flonase but this does not help. No face pain, no sore throat, no cough.  Past Medical History:  Diagnosis Date   ALLERGIC RHINITIS 06/23/2008   Anxiety    BENIGN PROSTATIC HYPERTROPHY, HX OF 07/08/2008   Blood in stool    noted 12-22 in stool and toilet paper-? hemorrhoid- BRB   Blood transfusion without reported diagnosis    Chronic renal insufficiency, stage 2 (mild)    GFR low 60s   Hypercholesterolemia 09/2018   Recommended statin 09/2018; pt elected to do TLC   Hypertension    Increased prostate specific antigen (PSA) velocity 2019   10/13/18 urol f/u-->surveillance chosen over bx-->f/u urol/rpt PSA 6 mo.   Insomnia    LOW BACK PAIN 06/25/2010   NEPHROLITHIASIS, HX OF 06/23/2008   Palpitations 03/2020   EKGs with PVCs-->cardiol saw him and said no further w/u necessary.   Psoriasis    methotrexat at one point but changed to home UV light therapy qod and doing well (as of 03/2020)   Vitiligo     Past Surgical History:  Procedure Laterality Date   COLONOSCOPY  2011;2017   2011 adenomatous polyp.  12/29/15 hyperplastic polyps.  Recall 10 yrs.   GANGLION CYST EXCISION Left    wrist    SPERMATOCELECTOMY      Family History  Problem Relation Age of Onset   Colon polyps Father    Colitis Mother    Diverticulosis Mother    Colon polyps Mother    Colon cancer Neg Hx    Esophageal cancer Neg Hx    Rectal cancer Neg Hx    Stomach cancer Neg Hx     Social  History   Socioeconomic History   Marital status: Married    Spouse name: Not on file   Number of children: Not on file   Years of education: Not on file   Highest education level: Not on file  Occupational History   Not on file  Tobacco Use   Smoking status: Never   Smokeless tobacco: Never  Substance and Sexual Activity   Alcohol use: No    Alcohol/week: 0.0 standard drinks of alcohol   Drug use: No   Sexual activity: Not on file  Other Topics Concern   Not on file  Social History Narrative   Married, 2 grown sons 2 adult daughters.   [employer] [place]   No tobacco.   No alcohol.   Social Determinants of Health   Financial Resource Strain: Not on file  Food Insecurity: Not on file  Transportation Needs: Not on file  Physical Activity: Not on file  Stress: Not on file  Social Connections: Not on file  Intimate Partner Violence: Not on file    Outpatient Medications Prior to Visit  Medication Sig Dispense Refill   ondansetron (ZOFRAN)  4 MG tablet 1-2 tabs po tid prn nausea (Patient not taking: Reported on 05/20/2022) 30 tablet 1   tamsulosin (FLOMAX) 0.4 MG CAPS capsule Take 0.4 mg by mouth daily.     ALPRAZolam (XANAX) 0.25 MG tablet TAKE 1 TABLET BY MOUTH 1 time per day AS NEEDED. 45 tablet 0   atorvastatin (LIPITOR) 20 MG tablet TAKE 1 TABLET(20 MG) BY MOUTH DAILY 90 tablet 1   azithromycin (ZITHROMAX Z-PAK) 250 MG tablet Take 2 tabs today; then begin one tab once daily for 4 more days. 6 tablet 0   gabapentin (NEURONTIN) 100 MG capsule Take 1 capsule (100 mg total) by mouth 3 (three) times daily. Can increase to '300mg'$  before bed if needed. 90 capsule 3   lisinopril (ZESTRIL) 10 MG tablet TAKE 1 TABLET(10 MG) BY MOUTH DAILY 90 tablet 1   predniSONE (DELTASONE) 20 MG tablet Take one tab by mouth twice daily for 4 days, then one daily for 3 days. Take with food. 11 tablet 0   propranolol (INDERAL) 10 MG  tablet Take 1 tablet (10 mg total) by mouth as needed. 1 tab po bid prn 45 tablet 1   No facility-administered medications prior to visit.    Allergies  Allergen Reactions   Hydrocodone Nausea Only   Codeine Nausea And Vomiting    ROS Review of Systems  Constitutional:  Negative for appetite change, chills, fatigue and fever.  HENT:  Positive for rhinorrhea. Negative for congestion, dental problem, ear pain and sore throat.   Eyes:  Negative for discharge, redness and visual disturbance.  Respiratory:  Negative for cough, chest tightness, shortness of breath and wheezing.   Cardiovascular:  Negative for chest pain, palpitations and leg swelling.  Gastrointestinal:  Negative for abdominal pain, blood in stool, diarrhea, nausea and vomiting.  Genitourinary:  Negative for difficulty urinating, dysuria, flank pain, frequency, hematuria and urgency.  Musculoskeletal:  Negative for arthralgias, back pain, joint swelling, myalgias and neck stiffness.  Skin:  Negative for pallor and rash.  Neurological:  Negative for dizziness, speech difficulty, weakness and headaches.  Hematological:  Negative for adenopathy. Does not bruise/bleed easily.  Psychiatric/Behavioral:  Negative for confusion and sleep disturbance. The patient is not nervous/anxious.     PE;    11/22/2022    8:01 AM 08/16/2022   11:26 AM 05/20/2022    8:06 AM  Vitals with BMI  Height 5' 11.75"  5' 11.75"  Weight 215 lbs 10 oz  209 lbs 6 oz  BMI 60.63  01.60  Systolic 109 323 557  Diastolic 71 77 73  Pulse 71 62 59   Gen: Alert, well appearing.  Patient is oriented to person, place, time, and situation. AFFECT: pleasant, lucid thought and speech. ENT: Ears: EACs clear, normal epithelium.  TMs with good light reflex and landmarks bilaterally.  Eyes: no injection, icteris, swelling, or exudate.  EOMI, PERRLA. Nose: no drainage or turbinate edema/swelling.  No injection or focal lesion.  Mouth: lips without lesion/swelling.   Oral mucosa pink and moist.  Dentition intact and without obvious caries or gingival swelling.  Oropharynx without erythema, exudate, or swelling.  Neck: supple/nontender.  No LAD, mass, or TM.  Carotid pulses 2+ bilaterally, without bruits. CV: RRR, no m/r/g.   LUNGS: CTA bilat, nonlabored resps, good aeration in all lung fields. ABD: soft, NT, ND, BS normal.  No hepatospenomegaly or mass.  No bruits. EXT: no clubbing, cyanosis, or edema.  Musculoskeletal: no joint swelling, erythema, warmth, or tenderness.  ROM  of all joints intact. Skin - no sores or suspicious lesions or rashes or color changes  Pertinent labs:  Lab Results  Component Value Date   TSH 1.73 11/19/2021   Lab Results  Component Value Date   WBC 4.7 11/19/2021   HGB 15.5 11/19/2021   HCT 46.9 11/19/2021   MCV 88.4 11/19/2021   PLT 178.0 11/19/2021   Lab Results  Component Value Date   CREATININE 1.24 05/20/2022   BUN 25 (H) 05/20/2022   NA 140 05/20/2022   K 4.1 05/20/2022   CL 105 05/20/2022   CO2 28 05/20/2022   Lab Results  Component Value Date   ALT 21 05/20/2022   AST 18 05/20/2022   ALKPHOS 74 05/20/2022   BILITOT 0.9 05/20/2022   Lab Results  Component Value Date   CHOL 120 05/20/2022   Lab Results  Component Value Date   HDL 38.60 (L) 05/20/2022   Lab Results  Component Value Date   LDLCALC 63 05/20/2022   Lab Results  Component Value Date   TRIG 91.0 05/20/2022   Lab Results  Component Value Date   CHOLHDL 3 05/20/2022   Lab Results  Component Value Date   PSA 3.72 11/19/2021   PSA 3.84 09/28/2018   PSA 2.93 09/08/2017   ASSESSMENT AND PLAN:   #1 health maintenance exam: Reviewed age and gender appropriate health maintenance issues (prudent diet, regular exercise, health risks of tobacco and excessive alcohol, use of seatbelts, fire alarms in home, use of sunscreen).  Also reviewed age and gender appropriate health screening as well as vaccine recommendations. Vaccines:  Tdap->declined.  Shingrix->declined.  Flu->UTD. Labs: Fasting health panel and PSA Prostate ca screening: PSA today Colon ca screening: Recall 2027  #2 hypertension, well-controlled on lisinopril 10 mg a day.  #3 hyperlipidemia, doing well on a atorvastatin 20 mg a day.  4.  Rhinitis. Will do trial of ipratropium nasal spray 2 sprays every 12 hours as needed.  #5 situational anxiety.  Public speaking--occasionally uses propranolol and occasionally uses alprazolam.  An After Visit Summary was printed and given to the patient.  FOLLOW UP:  Return in about 6 months (around 05/24/2023) for routine chronic illness f/u.  Signed:  Crissie Sickles, MD           11/22/2022

## 2023-01-01 ENCOUNTER — Telehealth (INDEPENDENT_AMBULATORY_CARE_PROVIDER_SITE_OTHER): Payer: 59 | Admitting: Family Medicine

## 2023-01-01 ENCOUNTER — Encounter: Payer: Self-pay | Admitting: Family Medicine

## 2023-01-01 DIAGNOSIS — U071 COVID-19: Secondary | ICD-10-CM | POA: Diagnosis not present

## 2023-01-01 DIAGNOSIS — J988 Other specified respiratory disorders: Secondary | ICD-10-CM

## 2023-01-01 MED ORDER — NIRMATRELVIR/RITONAVIR (PAXLOVID)TABLET: 3.0000 | ORAL_TABLET | Freq: Two times a day (BID) | ORAL | 0 refills | Status: AC

## 2023-01-01 NOTE — Progress Notes (Signed)
Virtual Visit via Video Note  I connected with Todd Huff  on 01/01/23 at  4:00 PM EST by a video enabled telemedicine application and verified that I am speaking with the correct person using two identifiers.  Location patient: [place] Location provider:work or home office Persons participating in the virtual visit: patient, provider  I discussed the limitations and requested verbal permission for telemedicine visit. The patient expressed understanding and agreed to proceed.   HPI: 62 y/o male being seen today for respiratory illness. Onset 2 days ago of nasal congestion/runny nose, postnasal drip, headache, sore throat, cough, body aches, fatigue, fever.  Denies shortness of breath or chest pain. No nausea vomiting or diarrhea.  No rash. Positive for COVID last night. Wife with same illness, COVID-positive.   ROS: See pertinent positives and negatives per HPI.  Past Medical History:  Diagnosis Date   ALLERGIC RHINITIS 06/23/2008   Anxiety    BENIGN PROSTATIC HYPERTROPHY, HX OF 07/08/2008   Blood in stool    noted 12-22 in stool and toilet paper-? hemorrhoid- BRB   Blood transfusion without reported diagnosis    Chronic renal insufficiency, stage 2 (mild)    GFR low 60s   Hypercholesterolemia 09/2018   Recommended statin 09/2018; pt elected to do TLC   Hypertension    Increased prostate specific antigen (PSA) velocity 2019   10/13/18 urol f/u-->surveillance chosen over bx-->f/u urol/rpt PSA 6 mo.   Insomnia    LOW BACK PAIN 06/25/2010   NEPHROLITHIASIS, HX OF 06/23/2008   Palpitations 03/2020   EKGs with PVCs-->cardiol saw him and said no further w/u necessary.   Psoriasis    methotrexat at one point but changed to home UV light therapy qod and doing well (as of 03/2020)   Vitiligo     Past Surgical History:  Procedure Laterality Date   COLONOSCOPY  2011;2017   2011 adenomatous polyp.  12/29/15 hyperplastic polyps.  Recall 10 yrs.   GANGLION CYST EXCISION Left    wrist     SPERMATOCELECTOMY       Current Outpatient Medications:    ALPRAZolam (XANAX) 0.25 MG tablet, TAKE 1 TABLET BY MOUTH 1 time per day AS NEEDED., Disp: 15 tablet, Rfl: 1   atorvastatin (LIPITOR) 20 MG tablet, TAKE 1 TABLET(20 MG) BY MOUTH DAILY, Disp: 90 tablet, Rfl: 1   lisinopril (ZESTRIL) 10 MG tablet, TAKE 1 TABLET(10 MG) BY MOUTH DAILY, Disp: 90 tablet, Rfl: 1   propranolol (INDERAL) 10 MG tablet, Take 1 tablet (10 mg total) by mouth 2 (two) times daily as needed., Disp: 180 tablet, Rfl: 0   tamsulosin (FLOMAX) 0.4 MG CAPS capsule, Take 0.4 mg by mouth daily., Disp: , Rfl:    ondansetron (ZOFRAN) 4 MG tablet, 1-2 tabs po tid prn nausea (Patient not taking: Reported on 05/20/2022), Disp: 30 tablet, Rfl: 1  EXAM:  VITALS per patient if applicable:     01/22/3784    8:01 AM 08/16/2022   11:26 AM 05/20/2022    8:06 AM  Vitals with BMI  Height 5' 11.75"  5' 11.75"  Weight 215 lbs 10 oz  209 lbs 6 oz  BMI 88.50  27.74  Systolic 128 786 767  Diastolic 71 77 73  Pulse 71 62 59     GENERAL: alert, oriented, appears well and in no acute distress  HEENT: atraumatic, conjunttiva clear, no obvious abnormalities on inspection of external nose and ears  NECK: normal movements of the head and neck  LUNGS: on inspection no signs  of respiratory distress, breathing rate appears normal, no obvious gross SOB, gasping or wheezing  CV: no obvious cyanosis  MS: moves all visible extremities without noticeable abnormality  PSYCH/NEURO: pleasant and cooperative, no obvious depression or anxiety, speech and thought processing grossly intact  LABS: none today   Chemistry      Component Value Date/Time   NA 139 11/22/2022 0814   K 4.1 11/22/2022 0814   CL 104 11/22/2022 0814   CO2 32 11/22/2022 0814   BUN 20 11/22/2022 0814   CREATININE 1.21 11/22/2022 0814   CREATININE 1.31 (H) 04/04/2020 0904      Component Value Date/Time   CALCIUM 9.0 11/22/2022 0814   ALKPHOS 80 11/22/2022 0814   AST  22 11/22/2022 0814   ALT 27 11/22/2022 0814   BILITOT 0.8 11/22/2022 0814      ASSESSMENT AND PLAN:  Discussed the following assessment and plan:  COVID virus respiratory illness. Patient's risk factors for complication due to COVID are: Age 28, BMI 29. Paxlovid prescribed. GFR 64. Get otc generic robitussin DM OR Mucinex DM and use as directed on the packaging for cough and congestion. Use otc generic saline nasal spray 2-3 times per day to irrigate/moisturize your nasal passages.   I discussed the assessment and treatment plan with the patient. The patient was provided an opportunity to ask questions and all were answered. The patient agreed with the plan and demonstrated an understanding of the instructions.   F/u: if not signif imp in 3-4d  Signed:  Crissie Sickles, MD           01/01/2023

## 2023-01-04 ENCOUNTER — Encounter: Payer: Self-pay | Admitting: Emergency Medicine

## 2023-01-04 ENCOUNTER — Ambulatory Visit
Admission: EM | Admit: 2023-01-04 | Discharge: 2023-01-04 | Disposition: A | Discharge status: Home / Self Care | Payer: 59 | Attending: Emergency Medicine | Admitting: Emergency Medicine

## 2023-01-04 ENCOUNTER — Ambulatory Visit: Admit: 2023-01-04 | Payer: 59

## 2023-01-04 DIAGNOSIS — M546 Pain in thoracic spine: Secondary | ICD-10-CM | POA: Diagnosis not present

## 2023-01-04 DIAGNOSIS — Z8619 Personal history of other infectious and parasitic diseases: Secondary | ICD-10-CM | POA: Diagnosis not present

## 2023-01-04 MED ORDER — VALACYCLOVIR HCL 1 G PO TABS: 1000.0000 mg | ORAL_TABLET | Freq: Three times a day (TID) | ORAL | 0 refills | Status: AC

## 2023-01-04 NOTE — ED Triage Notes (Signed)
Patient c/o possible shingles.  Noticed this am burning and tingling from his back radiating around to the front.  No rash.

## 2023-01-04 NOTE — Discharge Instructions (Addendum)
I recommend using the gabapentin twice daily and monitoring for improvement in symptoms. You can also try tylenol for back pain.  As discussed, if you notice rash appear on one side of the body, you can fill the paper prescription for Valtrex.   Please follow with your primary care provider as needed.

## 2023-01-04 NOTE — ED Provider Notes (Signed)
[name]    CSN: [id number] Arrival date & time: 01/04/23  1030      History   Chief Complaint Chief Complaint  Patient presents with   Possible Shingles    HPI Kennis Wissmann is a 62 y.o. male.  Presents with concern for shingles Reports this morning had burning, tingling sensation on his back Bilateral mid back. Denies rash. Denies recent strenuous exercise or heavy lifting Has not taken any medications   Hx of shingles 1 year ago Declined shingles vaccine at last PCP visit Reports this feels similar to his last shingles outbreak  Of note patient tested positive for covid 4 days ago Taking paxlovid   Past Medical History:  Diagnosis Date   ALLERGIC RHINITIS 06/23/2008   Anxiety    BENIGN PROSTATIC HYPERTROPHY, HX OF 07/08/2008   Blood in stool    noted 12-22 in stool and toilet paper-? hemorrhoid- BRB   Blood transfusion without reported diagnosis    Chronic renal insufficiency, stage 2 (mild)    GFR low 60s   Hypercholesterolemia 09/2018   Recommended statin 09/2018; pt elected to do TLC   Hypertension    Increased prostate specific antigen (PSA) velocity 2019   10/13/18 urol f/u-->surveillance chosen over bx-->f/u urol/rpt PSA 6 mo.   Insomnia    LOW BACK PAIN 06/25/2010   NEPHROLITHIASIS, HX OF 06/23/2008   Palpitations 03/2020   EKGs with PVCs-->cardiol saw him and said no further w/u necessary.   Psoriasis    methotrexat at one point but changed to home UV light therapy qod and doing well (as of 03/2020)   Vitiligo     Patient Active Problem List   Diagnosis Date Noted   Nonspecific abnormal electrocardiogram (ECG) (EKG) 06/16/2020   PVC (premature ventricular contraction) 06/16/2020   History of colonic polyps 02/24/2015   LOW BACK PAIN 06/25/2010   GANGLION CYST, WRIST, LEFT 01/04/2010   BENIGN PROSTATIC HYPERTROPHY, HX OF 07/08/2008   ALLERGIC RHINITIS 06/23/2008   NEPHROLITHIASIS, HX OF 06/23/2008    Past Surgical History:   Procedure Laterality Date   COLONOSCOPY  2011;2017   2011 adenomatous polyp.  12/29/15 hyperplastic polyps.  Recall 10 yrs.   GANGLION CYST EXCISION Left    wrist    SPERMATOCELECTOMY         Home Medications    Prior to Admission medications   Medication Sig Start Date End Date Taking? Authorizing Provider  ALPRAZolam (XANAX) 0.25 MG tablet TAKE 1 TABLET BY MOUTH 1 time per day AS NEEDED. 11/22/22  Yes McGowen, Adrian Blackwater, MD  atorvastatin (LIPITOR) 20 MG tablet TAKE 1 TABLET(20 MG) BY MOUTH DAILY 11/22/22  Yes McGowen, Adrian Blackwater, MD  lisinopril (ZESTRIL) 10 MG tablet TAKE 1 TABLET(10 MG) BY MOUTH DAILY 11/22/22  Yes McGowen, Adrian Blackwater, MD  nirmatrelvir/ritonavir (PAXLOVID) 20 x 150 MG & 10 x '100MG'$  TABS Take 3 tablets by mouth 2 (two) times daily for 5 days. Take nirmatrelvir (150 mg) two tablets twice daily for 5 days and ritonavir (100 mg) one tablet twice daily for 5 days. 01/01/23 01/06/23 Yes McGowen, Adrian Blackwater, MD  ondansetron [facility]) 4 MG tablet 1-2 tabs po tid prn nausea 01/26/19  Yes McGowen, Adrian Blackwater, MD  propranolol (INDERAL) 10 MG tablet Take 1 tablet (10 mg total) by mouth 2 (two) times daily as needed. 11/22/22  Yes McGowen, Adrian Blackwater, MD  tamsulosin (FLOMAX) 0.4 MG CAPS capsule Take 0.4 mg by mouth daily. 05/03/21  Yes [provider]  valACYclovir (VALTREX) 1000 MG tablet Take 1 tablet (1,000 mg total) by mouth 3 (three) times daily for 7 days. 01/04/23 01/11/23 Yes Erminie Foulks, Wells Guiles, PA-C    Family History Family History  Problem Relation Age of Onset   Colitis Mother    Diverticulosis Mother    Colon polyps Mother    Colon polyps Father    Colon cancer Neg Hx    Esophageal cancer Neg Hx    Rectal cancer Neg Hx    Stomach cancer Neg Hx     Social History Social History   Tobacco Use   Smoking status: Never   Smokeless tobacco: Never  Substance Use Topics   Alcohol use: No    Alcohol/week: 0.0 standard drinks of alcohol   Drug use: No     Allergies    Hydrocodone and Codeine   Review of Systems Review of Systems As per HPI  Physical Exam Triage Vital Signs ED Triage Vitals  Enc Vitals Group     BP      Pulse      Resp      Temp      Temp src      SpO2      Weight      Height      Head Circumference      Peak Flow      Pain Score      Pain Loc      Pain Edu?      Excl. in [place]?    No data found.  Updated Vital Signs BP 124/79 (BP Location: Right Arm)   Pulse 85   Temp 98.2 F (36.8 C) (Oral)   Resp 18   Ht '5\' 10"'$  (1.778 m)   Wt 210 lb (95.3 kg)   SpO2 94%   BMI 30.13 kg/m   Physical Exam Vitals and nursing note reviewed.  Constitutional:      General: He is not in acute distress.    Appearance: Normal appearance.  HENT:     Mouth/Throat:     Pharynx: Oropharynx is clear.  Eyes:     Conjunctiva/sclera: Conjunctivae normal.  Cardiovascular:     Rate and Rhythm: Normal rate and regular rhythm.     Pulses: Normal pulses.  Pulmonary:     Effort: Pulmonary effort is normal.  Musculoskeletal:        General: Normal range of motion.     Comments: Thoracic paraspinals are mildly tender to palpation  Skin:    General: Skin is warm and dry.     Findings: No erythema, lesion or rash.     Comments: No rash or skin changes  Neurological:     Mental Status: He is alert and oriented to person, place, and time.     UC Treatments / Results  Labs (all labs ordered are listed, but only abnormal results are displayed) Labs Reviewed - No data to display  EKG  Radiology No results found.  Procedures Procedures (including critical care time)  Medications Ordered in UC Medications - No data to display  Initial Impression / Assessment and Plan / UC Course  I have reviewed the triage vital signs and the nursing notes.  Pertinent labs & imaging results that were available during my care of the patient were reviewed by me and considered in my medical decision making (see chart for details).  Patient  describes burning sensation across the back, bilaterally We discussed shingles presents only in one dermatome Back pain may  be related to covid infection/body aches, especially given the tenderness on palpation. For now I recommend gabapentin twice daily, see if this improves the burning. Patient has medication left over from previous prescriptions. Additionally can try tylenol.  I have provided paper proscription for valtrex. We discussed if he develops unilateral rash, can fill and take medication. Best if started within 3 days of onset. No interactions with valtrex and paxlovid.  Recommend follow with PCP as needed. Patient agreeable to plan  Final Clinical Impressions(s) / UC Diagnoses   Final diagnoses:  Acute bilateral thoracic back pain  History of shingles     Discharge Instructions      I recommend using the gabapentin twice daily and monitoring for improvement in symptoms. You can also try tylenol for back pain.  As discussed, if you notice rash appear on one side of the body, you can fill the paper prescription for Valtrex.   Please follow with your primary care provider as needed.     ED Prescriptions     Medication Sig Dispense Auth. Provider   valACYclovir (VALTREX) 1000 MG tablet Take 1 tablet (1,000 mg total) by mouth 3 (three) times daily for 7 days. 21 tablet Arben Packman, Wells Guiles, PA-C      PDMP not reviewed this encounter.   [place], [place] 01/04/23 1116

## 2023-01-06 ENCOUNTER — Telehealth: Payer: Self-pay

## 2023-01-06 NOTE — Telephone Encounter (Signed)
Chart reviewed. He went to [place] urgent care 01/04/2023 and Valtrex was prescribed (ED provider says he gave him a printed rx for pt to start if rash started coming up. Is he requesting pain medicines?  Pls clarify.-thx

## 2023-01-06 NOTE — Telephone Encounter (Signed)
Please Advise

## 2023-01-06 NOTE — Telephone Encounter (Signed)
Pt does not need pain meds or different med.

## 2023-01-06 NOTE — Telephone Encounter (Signed)
[facility] - Client TELEPHONE ADVICE RECORD [facility] Patient Name: Todd Huff Gender: Male DOB: Aug 15, 1961 Age: 62 Y 1 M 10 D Return Phone Number: [phone] (Primary) Address: City/ State/ Zip: [place]  [address] Client [facility] - Client Client Site [place] - Day Provider Crissie Sickles - MD Contact Type Call Who Is Calling Patient / Member / Family / Caregiver Call Type Triage / Clinical Relationship To Patient Self Return Phone Number [phone] (Primary) Chief Complaint Rash - Localized Reason for Call Symptomatic / Request for [place] states he may have shingles. He is requesting medication. He was seen for Covid this week Translation No Nurse Assessment Nurse: Mariel Sleet, RN, Erasmo Downer Date/Time (Eastern Time): 01/04/2023 9:44:07 AM Confirm and document reason for call. If symptomatic, describe symptoms. ---Caller states that he has a burning in his skin that goes around to the front, he has had shingles before and feels the same. Does the patient have any new or worsening symptoms? ---Yes Will a triage be completed? ---Yes Related visit to physician within the last 2 weeks? ---Yes Does the PT have any chronic conditions? (i.e. diabetes, asthma, this includes High risk factors for pregnancy, etc.) ---No Is this a behavioral health or substance abuse call? ---No Guidelines Guideline Title Affirmed Question Affirmed Notes Nurse Date/Time (Eastern Time) Shingles (Zoster) [1] Shingles rash (matches SYMPTOMS) AND [2] onset < 72 hours ago (3 days) Mariel Sleet, RN, [facility] 01/04/2023 9:45:24 AM Disp. Time [name] Time) Disposition Final User 01/04/2023 9:54:14 AM See PCP within 24 Hours Yes Olene Craven 01/04/2023 9:56:22 AM Called On-Call Provider Mariel Sleet, RN, Erasmo Downer PLEASE NOTE: All timestamps contained within this report are  represented as [place] Standard Time. CONFIDENTIALTY NOTICE: This fax transmission is intended only for the addressee. It contains information that is legally privileged, confidential or otherwise protected from use or disclosure. If you are not the intended recipient, you are strictly prohibited from reviewing, disclosing, copying using or disseminating any of this information or taking any action in reliance on or regarding this information. If you have received this fax in error, please notify us immediately by telephone so that we can arrange for its return to [place]. Phone: [phone], Toll-Free: [phone], Fax: [phone] Page: 2 of 2 Call Id: [id number] Final Disposition 01/04/2023 9:54:14 AM See PCP within 24 Hours Yes Mariel Sleet, RN, Laurin Coder Disagree/Comply Comply Caller Understands Yes PreDisposition Call Doctor Care Advice Given Per Guideline SEE PCP WITHIN 24 HOURS: * IF OFFICE WILL BE CLOSED: You need to be seen within the next 24 hours. A clinic or an urgent care center is often a good source of care if your doctor's office is closed or you can't get an appointment. CALL BACK IF: * Fever over 100.4 F (38.0 C) * You become worse CARE ADVICE given per Shingles (Adult) guideline. Comments User: Charlies Constable, RN Date/Time [name] Time): 01/04/2023 9:54:10 AM [facility] [address] [place], [place] [phone] NKDA Currently on Paxlovid Referrals GO TO FACILITY UNDECIDED Paging DoctorName Phone DateTime Result/ Outcome Message Type Notes Sabra Heck MD [phone] 01/04/2023 9:56:22 AM Called On Call Provider - Reached Doctor Paged Josephine Igo "Leroy Sea MD 01/04/2023 9:56:50 AM Spoke with On Call - General Message Result On call states he would have patient evaluated. Will advise pt.

## 2023-05-27 NOTE — Patient Instructions (Signed)

## 2023-05-28 ENCOUNTER — Encounter: Payer: Self-pay | Admitting: Family Medicine

## 2023-05-28 ENCOUNTER — Ambulatory Visit: Payer: 59 | Admitting: Family Medicine

## 2023-05-28 VITALS — BP 129/78 | HR 62 | Wt 204.4 lb

## 2023-05-28 DIAGNOSIS — F418 Other specified anxiety disorders: Secondary | ICD-10-CM | POA: Diagnosis not present

## 2023-05-28 DIAGNOSIS — Z79899 Other long term (current) drug therapy: Secondary | ICD-10-CM | POA: Diagnosis not present

## 2023-05-28 DIAGNOSIS — E7800 Pure hypercholesterolemia, unspecified: Secondary | ICD-10-CM

## 2023-05-28 DIAGNOSIS — I10 Essential (primary) hypertension: Secondary | ICD-10-CM | POA: Diagnosis not present

## 2023-05-28 LAB — LIPID PANEL
Cholesterol: 112 mg/dL (ref 0–200)
HDL: 33.9 mg/dL — ABNORMAL LOW (ref >39.00)
LDL Cholesterol: 59 mg/dL (ref 0–99)
NonHDL: 77.65
Total CHOL/HDL Ratio: 3
Triglycerides: 95 mg/dL (ref 0.0–149.0)
VLDL: 19 mg/dL (ref 0.0–40.0)

## 2023-05-28 LAB — COMPREHENSIVE METABOLIC PANEL
ALT: 16 U/L (ref 0–53)
AST: 15 U/L (ref 0–37)
Albumin: 4.1 g/dL (ref 3.5–5.2)
Alkaline Phosphatase: 74 U/L (ref 39–117)
BUN: 21 mg/dL (ref 6–23)
CO2: 29 mEq/L (ref 19–32)
Calcium: 8.9 mg/dL (ref 8.4–10.5)
Chloride: 105 mEq/L (ref 96–112)
Creatinine, Ser: 1.22 mg/dL (ref 0.40–1.50)
GFR: 63.1 mL/min (ref >60.00)
Glucose, Bld: 102 mg/dL — ABNORMAL HIGH (ref 70–99)
Potassium: 4.1 mEq/L (ref 3.5–5.1)
Sodium: 140 mEq/L (ref 135–145)
Total Bilirubin: 0.8 mg/dL (ref 0.2–1.2)
Total Protein: 6.4 g/dL (ref 6.0–8.3)

## 2023-05-28 MED ORDER — ALPRAZOLAM 0.25 MG PO TABS: ORAL_TABLET | ORAL | 1 refills | Status: AC

## 2023-05-28 MED ORDER — LISINOPRIL 10 MG PO TABS: ORAL_TABLET | ORAL | 1 refills | Status: DC

## 2023-05-28 MED ORDER — ATORVASTATIN CALCIUM 20 MG PO TABS: ORAL_TABLET | ORAL | 1 refills | Status: DC

## 2023-05-28 MED ORDER — PROPRANOLOL HCL 10 MG PO TABS: 10.0000 mg | ORAL_TABLET | Freq: Two times a day (BID) | ORAL | 0 refills | Status: AC | PRN

## 2023-05-28 NOTE — Progress Notes (Signed)
OFFICE VISIT  05/28/2023  CC:  Chief Complaint  Patient presents with   Medical Management of Chronic Issues    Patient is a 62 y.o. male who presents for 22-month follow-up hypertension, hyperlipidemia, and situational anxiety. A/P as of last visit: "1 hypertension, well-controlled on lisinopril 10 mg a day.   #2 hyperlipidemia, doing well on a atorvastatin 20 mg a day.   3.  Rhinitis. Will do trial of ipratropium nasal spray 2 sprays every 12 hours as needed.   #4 situational anxiety.  Public speaking--occasionally uses propranolol and occasionally uses alprazolam."  INTERIM HX: All labs were normal last visit.  Todd Huff feels well.  He remains active with daily walking for exercise. No problems with home blood pressures or with any of his medications. He continues to take small amounts of propranolol and small amounts of alprazolam as needed for anxiety.  No new concerns today.  PMP AWARE reviewed today: most recent rx for alprazolam was filled 11/22/2022, # 15, rx by me. No red flags.  ROS:  no fevers, no CP, no SOB, no wheezing, no cough, no dizziness, no HAs, no rashes, no melena/hematochezia.  No polyuria or polydipsia.  No myalgias or arthralgias.  No focal weakness, paresthesias, or tremors.  No acute vision or hearing abnormalities.  No dysuria or unusual/new urinary urgency or frequency.  No recent changes in lower legs. No n/v/d or abd pain.  No palpitations.    Past Medical History:  Diagnosis Date   ALLERGIC RHINITIS 06/23/2008   Anxiety    BENIGN PROSTATIC HYPERTROPHY, HX OF 07/08/2008   Blood in stool    noted 12-22 in stool and toilet paper-? hemorrhoid- BRB   Blood transfusion without reported diagnosis    Chronic renal insufficiency, stage 2 (mild)    GFR low 60s   Hypercholesterolemia 09/2018   Recommended statin 09/2018; pt elected to do TLC   Hypertension    Increased prostate specific antigen (PSA) velocity 2019   10/13/18 urol f/u-->surveillance chosen  over bx-->f/u urol/rpt PSA 6 mo.   Insomnia    LOW BACK PAIN 06/25/2010   NEPHROLITHIASIS, HX OF 06/23/2008   Palpitations 03/2020   EKGs with PVCs-->cardiol saw him and said no further w/u necessary.   Psoriasis    methotrexat at one point but changed to home UV light therapy qod and doing well (as of 03/2020)   Vitiligo     Past Surgical History:  Procedure Laterality Date   COLONOSCOPY  2011;2017   2011 adenomatous polyp.  12/29/15 hyperplastic polyps.  Recall 10 yrs.   GANGLION CYST EXCISION Left    wrist    SPERMATOCELECTOMY      Outpatient Medications Prior to Visit  Medication Sig Dispense Refill   ondansetron (ZOFRAN) 4 MG tablet 1-2 tabs po tid prn nausea 30 tablet 1   tamsulosin (FLOMAX) 0.4 MG CAPS capsule Take 0.4 mg by mouth daily.     ALPRAZolam (XANAX) 0.25 MG tablet TAKE 1 TABLET BY MOUTH 1 time per day AS NEEDED. 15 tablet 1   atorvastatin (LIPITOR) 20 MG tablet TAKE 1 TABLET(20 MG) BY MOUTH DAILY 90 tablet 1   lisinopril (ZESTRIL) 10 MG tablet TAKE 1 TABLET(10 MG) BY MOUTH DAILY 90 tablet 1   propranolol (INDERAL) 10 MG tablet Take 1 tablet (10 mg total) by mouth 2 (two) times daily as needed. 180 tablet 0   No facility-administered medications prior to visit.    Allergies  Allergen Reactions   Hydrocodone Nausea Only  Codeine Nausea And Vomiting    Review of Systems As per HPI  PE:    05/28/2023    7:57 AM 01/04/2023   10:40 AM 01/04/2023   10:38 AM  Vitals with BMI  Height  5\' 10"    Weight 204 lbs 6 oz 210 lbs   BMI  30.13   Systolic 129  124  Diastolic 78  79  Pulse 62  85     Physical Exam  Gen: Alert, well appearing.  Patient is oriented to person, place, time, and situation. AFFECT: pleasant, lucid thought and speech. CV: RRR, no m/r/g.   LUNGS: CTA bilat, nonlabored resps, good aeration in all lung fields. EXT: no clubbing or cyanosis.  no edema.    LABS:  Last CBC Lab Results  Component Value Date   WBC 3.6 (L) 11/22/2022   HGB  15.8 11/22/2022   HCT 46.7 11/22/2022   MCV 88.4 11/22/2022   RDW 13.7 11/22/2022   PLT 198.0 11/22/2022   Last metabolic panel Lab Results  Component Value Date   GLUCOSE 89 11/22/2022   NA 139 11/22/2022   K 4.1 11/22/2022   CL 104 11/22/2022   CO2 32 11/22/2022   BUN 20 11/22/2022   CREATININE 1.21 11/22/2022   CALCIUM 9.0 11/22/2022   PROT 6.3 11/22/2022   ALBUMIN 4.2 11/22/2022   BILITOT 0.8 11/22/2022   ALKPHOS 80 11/22/2022   AST 22 11/22/2022   ALT 27 11/22/2022   Last lipids Lab Results  Component Value Date   CHOL 120 11/22/2022   HDL 36.70 (L) 11/22/2022   LDLCALC 66 11/22/2022   TRIG 86.0 11/22/2022   CHOLHDL 3 11/22/2022   Last thyroid functions Lab Results  Component Value Date   TSH 1.60 11/22/2022   Lab Results  Component Value Date   PSA 3.56 11/22/2022   PSA 3.72 11/19/2021   PSA 3.84 09/28/2018   IMPRESSION AND PLAN:  1 hypertension, well-controlled on lisinopril 10 mg a day. Electrolytes and creatinine today.  #2 hyperlipidemia, doing well on a atorvastatin 20 mg a day. Lipid panel and hepatic panel today.   #3 situational anxiety.  Public speaking--occasionally uses propranolol and occasionally uses alprazolam.  An After Visit Summary was printed and given to the patient.  FOLLOW UP: Return in about 6 months (around 11/27/2023) for annual CPE (fasting).  Signed:  Santiago Bumpers, MD           05/28/2023

## 2023-06-09 ENCOUNTER — Encounter: Payer: Self-pay | Admitting: Emergency Medicine

## 2023-06-09 ENCOUNTER — Ambulatory Visit
Admission: EM | Admit: 2023-06-09 | Discharge: 2023-06-09 | Disposition: A | Discharge status: Home / Self Care | Payer: 59

## 2023-06-09 DIAGNOSIS — H8110 Benign paroxysmal vertigo, unspecified ear: Secondary | ICD-10-CM

## 2023-06-09 DIAGNOSIS — M62830 Muscle spasm of back: Secondary | ICD-10-CM

## 2023-06-09 MED ORDER — METAXALONE 800 MG PO TABS: 800.0000 mg | ORAL_TABLET | Freq: Three times a day (TID) | ORAL | 0 refills | Status: DC

## 2023-06-09 MED ORDER — MECLIZINE HCL 25 MG PO TABS: 25.0000 mg | ORAL_TABLET | Freq: Three times a day (TID) | ORAL | 0 refills | Status: AC | PRN

## 2023-06-09 NOTE — Discharge Instructions (Addendum)
Advised patient may take meclizine daily or as needed for vertigo.  Advised may take previously prescribed Robaxin or Skelaxin prescribed today for accompanying muscle spasms of back.  Encouraged increase daily water intake to 64 ounces per day while taking this medication.  Advised if symptoms worsen and/or unresolved please follow-up with PCP or here for further evaluation.
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
## 2023-07-22 ENCOUNTER — Encounter: Payer: Self-pay | Admitting: Family Medicine

## 2023-07-22 ENCOUNTER — Ambulatory Visit: Payer: 59 | Admitting: Family Medicine

## 2023-07-22 ENCOUNTER — Ambulatory Visit: Payer: 59 | Attending: Family Medicine

## 2023-07-22 VITALS — BP 114/76 | HR 78 | Wt 208.4 lb

## 2023-07-22 DIAGNOSIS — R002 Palpitations: Secondary | ICD-10-CM

## 2023-07-22 DIAGNOSIS — R000 Tachycardia, unspecified: Secondary | ICD-10-CM

## 2023-07-22 NOTE — Progress Notes (Unsigned)
Enrolled patient for a 7 day Zio XT monitor to be mailed to patients home  Nahser to read

## 2023-07-22 NOTE — Progress Notes (Signed)
OFFICE VISIT  07/22/2023  CC:  Chief Complaint  Patient presents with   Increasing Heart Rate    3 weeks; Pt states early this morning heart rate was running 175 for 15 mins. Been getting up to 160. Pt states when he feels heart rate is increasing he gets an uncomfortable feeling in his chest. Mostly increases in the evening/early morning.     Patient is a 62 y.o. male who presents for increased heart rate.  HPI: About 3 weeks ago Todd Huff started to notice his heart felt like it was fluttering in his chest intermittently.  He has a pulse oximeter at home and it shows a heart rate into the 140s-170s when this occurs. It occurs mostly in the evenings and sometimes even wakes him up from sleep and he feels a vague sense of tightness in his chest but otherwise no chest pain and no shortness of breath.  He has no diaphoresis, no dizziness, and no nausea.  These episodes last up to an hour or two sometimes. He walks daily for exercise and does fine with this. He drinks minimal caffeine, no change recently. About a week prior to onset of these palpitations he did start taking Skelaxin for some back pain issues.  He stopped the medication completely and the episodes have continued.  He has a history of similar symptoms that we evaluated back in 2021.  At that time he had PVCs.  His episodes were very brief and he did not have a pulse ox to document any rate at that time. It was felt that his symptoms were due to PVCs.  Since that time he had rare brief similar symptoms but nothing like the last 3 weeks.  He has propranolol that he takes only occasionally for anxiety.  He has not taken any in a long time.  Past Medical History:  Diagnosis Date   ALLERGIC RHINITIS 06/23/2008   Anxiety    BENIGN PROSTATIC HYPERTROPHY, HX OF 07/08/2008   Blood in stool    noted 12-22 in stool and toilet paper-? hemorrhoid- BRB   Blood transfusion without reported diagnosis    Chronic renal insufficiency, stage 2 (mild)     GFR low 60s   Hypercholesterolemia 09/2018   Recommended statin 09/2018; pt elected to do TLC   Hypertension    Increased prostate specific antigen (PSA) velocity 2019   10/13/18 urol f/u-->surveillance chosen over bx-->f/u urol/rpt PSA 6 mo.   Insomnia    LOW BACK PAIN 06/25/2010   NEPHROLITHIASIS, HX OF 06/23/2008   Palpitations 03/2020   EKGs with PVCs-->cardiol saw him and said no further w/u necessary.   Psoriasis    methotrexat at one point but changed to home UV light therapy qod and doing well (as of 03/2020)   Vitiligo     Past Surgical History:  Procedure Laterality Date   COLONOSCOPY  2011;2017   2011 adenomatous polyp.  12/29/15 hyperplastic polyps.  Recall 10 yrs.   GANGLION CYST EXCISION Left    wrist    SPERMATOCELECTOMY      Outpatient Medications Prior to Visit  Medication Sig Dispense Refill   ALPRAZolam (XANAX) 0.25 MG tablet TAKE 1 TABLET BY MOUTH 1 time per day AS NEEDED. 15 tablet 1   atorvastatin (LIPITOR) 20 MG tablet TAKE 1 TABLET(20 MG) BY MOUTH DAILY 90 tablet 1   lisinopril (ZESTRIL) 10 MG tablet TAKE 1 TABLET(10 MG) BY MOUTH DAILY 90 tablet 1   meclizine (ANTIVERT) 25 MG tablet Take 1 tablet (  25 mg total) by mouth 3 (three) times daily as needed for dizziness. 30 tablet 0   propranolol (INDERAL) 10 MG tablet Take 1 tablet (10 mg total) by mouth 2 (two) times daily as needed. 180 tablet 0   tamsulosin (FLOMAX) 0.4 MG CAPS capsule Take 0.4 mg by mouth 2 (two) times daily.     metaxalone (SKELAXIN) 800 MG tablet Take 1 tablet (800 mg total) by mouth 3 (three) times daily. 21 tablet 0   ondansetron (ZOFRAN) 4 MG tablet 1-2 tabs po tid prn nausea (Patient not taking: Reported on 06/09/2023) 30 tablet 1   No facility-administered medications prior to visit.    Allergies  Allergen Reactions   Hydrocodone Nausea Only   Codeine Nausea And Vomiting    Review of Systems  As per HPI  PE:    07/22/2023   11:13 AM 06/09/2023    9:41 AM 06/09/2023    9:39  AM  Vitals with BMI  Height  5\' 10"    Weight 208 lbs 6 oz 200 lbs   BMI  28.7   Systolic 114  121  Diastolic 76  79  Pulse 78  74     Physical Exam  Gen: Alert, well appearing.  Patient is oriented to person, place, time, and situation. AFFECT: pleasant, lucid thought and speech. Neck - No masses or thyromegaly  CV: RRR, no m/r/g.   LUNGS: CTA bilat, nonlabored resps, good aeration in all lung fields. EXT: no clubbing or cyanosis.  no edema.     LABS:  Last CBC Lab Results  Component Value Date   WBC 3.6 (L) 11/22/2022   HGB 15.8 11/22/2022   HCT 46.7 11/22/2022   MCV 88.4 11/22/2022   RDW 13.7 11/22/2022   PLT 198.0 11/22/2022   Last metabolic panel Lab Results  Component Value Date   GLUCOSE 102 (H) 05/28/2023   NA 140 05/28/2023   K 4.1 05/28/2023   CL 105 05/28/2023   CO2 29 05/28/2023   BUN 21 05/28/2023   CREATININE 1.22 05/28/2023   GFR 63.10 05/28/2023   CALCIUM 8.9 05/28/2023   PROT 6.4 05/28/2023   ALBUMIN 4.1 05/28/2023   BILITOT 0.8 05/28/2023   ALKPHOS 74 05/28/2023   AST 15 05/28/2023   ALT 16 05/28/2023   Last thyroid functions Lab Results  Component Value Date   TSH 1.60 11/22/2022   IMPRESSION AND PLAN:  Episodic palpitations with tachycardia in the 140s to 170s range for the last 3 weeks. Remote history of symptomatic PVCs. Will arrange outpatient rhythm monitoring and echocardiogram. Check electrolytes and thyroid today. He can take his propranolol as needed if symptoms get bothersome enough. He saw Dr. Elease Hashimoto in cardiology but this was over 3 years ago and we would likely need to get him a new referral.  Will hold off on this today though.  An After Visit Summary was printed and given to the patient.  FOLLOW UP: Return in about 4 weeks (around 08/19/2023) for f/u palpitations/tachycardia.  Signed:  Santiago Bumpers, MD           07/22/2023

## 2023-07-25 ENCOUNTER — Ambulatory Visit (HOSPITAL_COMMUNITY)
Admission: RE | Admit: 2023-07-25 | Discharge: 2023-07-25 | Disposition: A | Discharge status: Home / Self Care | Payer: 59 | Source: Ambulatory Visit | Attending: Family Medicine | Admitting: Family Medicine

## 2023-07-25 DIAGNOSIS — R002 Palpitations: Secondary | ICD-10-CM

## 2023-07-25 DIAGNOSIS — R000 Tachycardia, unspecified: Secondary | ICD-10-CM | POA: Diagnosis not present

## 2023-07-25 DIAGNOSIS — I340 Nonrheumatic mitral (valve) insufficiency: Secondary | ICD-10-CM | POA: Diagnosis not present

## 2023-07-25 DIAGNOSIS — I4892 Unspecified atrial flutter: Secondary | ICD-10-CM | POA: Diagnosis not present

## 2023-07-28 ENCOUNTER — Encounter: Payer: Self-pay | Admitting: Family Medicine

## 2023-07-31 ENCOUNTER — Encounter (INDEPENDENT_AMBULATORY_CARE_PROVIDER_SITE_OTHER): Payer: Self-pay

## 2023-07-31 ENCOUNTER — Encounter: Payer: Self-pay | Admitting: Family Medicine

## 2023-08-08 ENCOUNTER — Encounter: Payer: Self-pay | Admitting: Family Medicine

## 2023-08-08 ENCOUNTER — Other Ambulatory Visit: Payer: Self-pay | Admitting: Family Medicine

## 2023-08-08 DIAGNOSIS — I471 Supraventricular tachycardia, unspecified: Secondary | ICD-10-CM

## 2023-08-08 MED ORDER — VERAPAMIL HCL ER 120 MG PO TBCR: 120.0000 mg | EXTENDED_RELEASE_TABLET | Freq: Every day | ORAL | 1 refills | Status: DC

## 2023-08-15 ENCOUNTER — Encounter: Payer: Self-pay | Admitting: Family Medicine

## 2023-08-15 ENCOUNTER — Other Ambulatory Visit (HOSPITAL_BASED_OUTPATIENT_CLINIC_OR_DEPARTMENT_OTHER): Payer: 59

## 2023-08-20 NOTE — Telephone Encounter (Signed)
Okay to wait until November. However, I would like him to follow-up with me sometime in the next few weeks.

## 2023-08-22 NOTE — Patient Instructions (Signed)

## 2023-08-25 ENCOUNTER — Ambulatory Visit: Payer: 59 | Admitting: Family Medicine

## 2023-08-25 ENCOUNTER — Encounter: Payer: Self-pay | Admitting: Family Medicine

## 2023-08-25 VITALS — BP 117/68 | HR 76 | Temp 98.4°F | Ht 70.0 in | Wt 209.0 lb

## 2023-08-25 DIAGNOSIS — R072 Precordial pain: Secondary | ICD-10-CM | POA: Diagnosis not present

## 2023-08-25 DIAGNOSIS — I471 Supraventricular tachycardia, unspecified: Secondary | ICD-10-CM

## 2023-08-25 DIAGNOSIS — R002 Palpitations: Secondary | ICD-10-CM | POA: Diagnosis not present
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
## 2023-09-01 ENCOUNTER — Encounter: Payer: Self-pay | Admitting: Cardiovascular Disease

## 2023-09-30 ENCOUNTER — Other Ambulatory Visit: Payer: Self-pay | Admitting: Family Medicine

## 2023-10-05 ENCOUNTER — Other Ambulatory Visit: Payer: Self-pay | Admitting: Family Medicine

## 2023-10-30 ENCOUNTER — Encounter: Payer: Self-pay | Admitting: Cardiovascular Disease
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
## 2023-10-31 ENCOUNTER — Ambulatory Visit: Payer: 59 | Attending: Cardiovascular Disease | Admitting: Cardiovascular Disease

## 2023-10-31 ENCOUNTER — Telehealth: Payer: Self-pay | Admitting: *Deleted

## 2023-10-31 ENCOUNTER — Encounter: Payer: Self-pay | Admitting: Cardiovascular Disease

## 2023-10-31 VITALS — BP 122/76 | HR 71 | Ht 69.0 in | Wt 213.1 lb

## 2023-10-31 DIAGNOSIS — I471 Supraventricular tachycardia, unspecified: Secondary | ICD-10-CM

## 2023-10-31 DIAGNOSIS — R000 Tachycardia, unspecified: Secondary | ICD-10-CM

## 2023-10-31 DIAGNOSIS — R002 Palpitations: Secondary | ICD-10-CM

## 2023-10-31 NOTE — Telephone Encounter (Signed)
-----   Message from Cassie H sent at 10/31/2023 11:08 AM EST ----- Regarding: RE: EP REFERRAL PER DR. Elease Hashimoto Pt is scheduled with Me[facility] on 12/12! ----- Message ----- From: Loa Socks, LPN Sent: [id number]  10:44 AM EST To: Ronn Melena; Noe Gens; # Subject: EP REFERRAL PER DR. Elease Hashimoto                     Dr. Elease Hashimoto saw this pt in clinic and referred him to EP for consideration of SVT ablation  Referral is in  Can you please call the pt and schedule him for this appt?  Shoot Sarah the date for follow-up purposes?   Thanks friends, Lajoyce Corners

## 2023-10-31 NOTE — Patient Instructions (Signed)
Medication Instructions:   Your physician recommends that you continue on your current medications as directed. Please refer to the Current Medication list given to you today.  *If you need a refill on your cardiac medications before your next appointment, please call your pharmacy*   You have been referred to SEE AN ELECTROPHYSIOLOGIST HERE IN OUR OFFICE FOR CONSIDERATION OF SVT ABLATION    Follow-Up:  4-5 MONTHS WITH DR. Elease Hashimoto OR AN EXTENDER

## 2023-11-27 ENCOUNTER — Ambulatory Visit: Payer: 59 | Admitting: Cardiovascular Disease

## 2023-11-28 ENCOUNTER — Encounter: Payer: 59 | Admitting: Family Medicine

## 2023-12-01 ENCOUNTER — Other Ambulatory Visit: Payer: Self-pay | Admitting: Family Medicine

## 2023-12-03 ENCOUNTER — Other Ambulatory Visit: Payer: Self-pay | Admitting: Family Medicine

## 2023-12-21 NOTE — Progress Notes (Signed)
 Office Note 12/22/2023  CC:  Chief Complaint  Patient presents with   Annual Exam    HPI:  Patient is a 63 y.o. male who is here for annual health maintenance exam and f/u HTN and HLD.  Dr. Alveta referred him to EP for consideration of SVT ablation. Appt is set for later today. Michale does have some periods in which he feels palpitations despite use of verapamil  SR 120 daily and he does find use of propranolol  as needed helpful.  No acute concerns.  Past Medical History:  Diagnosis Date   ALLERGIC RHINITIS 06/23/2008   Anxiety    BENIGN PROSTATIC HYPERTROPHY, HX OF 07/08/2008   Blood in stool    noted 12-22 in stool and toilet paper-? hemorrhoid- BRB   Blood transfusion without reported diagnosis    Chronic renal insufficiency, stage 2 (mild)    GFR low 60s   Hypercholesterolemia 09/2018   Recommended statin 09/2018; pt elected to do TLC   Hypertension    Increased prostate specific antigen (PSA) velocity 2019   10/13/18 urol f/u-->surveillance chosen over bx-->f/u urol/rpt PSA 6 mo.   Insomnia    LOW BACK PAIN 06/25/2010   NEPHROLITHIASIS, HX OF 06/23/2008   Palpitations 03/2020   EKGs with PVCs-->cardiol saw him and said no further w/u necessary.   Psoriasis    methotrexat at one point but changed to home UV light therapy qod and doing well (as of 03/2020)   SVT (supraventricular tachycardia) (HCC)    Vitiligo     Past Surgical History:  Procedure Laterality Date   COLONOSCOPY  2011;2017   2011 adenomatous polyp.  12/29/15 hyperplastic polyps.  Recall 10 yrs.   GANGLION CYST EXCISION Left    wrist    SPERMATOCELECTOMY     TRANSTHORACIC ECHOCARDIOGRAM     07/25/23 NORMAL   ZIO monitoring     07/2023 signif SVT->verap started    Family History  Problem Relation Age of Onset   Colitis Mother    Diverticulosis Mother    Colon polyps Mother    Colon polyps Father    Colon cancer Neg Hx    Esophageal cancer Neg Hx    Rectal cancer Neg Hx    Stomach cancer  Neg Hx     Social History   Socioeconomic History   Marital status: Married    Spouse name: Not on file   Number of children: Not on file   Years of education: Not on file   Highest education level: Bachelor's degree (e.g., BA, AB, BS)  Occupational History   Not on file  Tobacco Use   Smoking status: Never   Smokeless tobacco: Never  Vaping Use   Vaping status: Never Used  Substance and Sexual Activity   Alcohol use: No    Alcohol/week: 0.0 standard drinks of alcohol   Drug use: No   Sexual activity: Not Currently  Other Topics Concern   Not on file  Social History Narrative   Married, 2 grown sons 2 adult daughters.   Quality it trainer for company [place]   Orig from [place], [place] since his 63s.   No tobacco.   No alcohol.   Social Drivers of Corporate Investment Banker Strain: Low Risk  (12/21/2023)   Overall Financial Resource Strain (CARDIA)    Difficulty of Paying Living Expenses: Not hard at all  Food Insecurity: No Food Insecurity (12/21/2023)   Hunger Vital Sign    Worried About Programme Researcher, Broadcasting/film/video in  the Last Year: Never true    Ran Out of Food in the Last Year: Never true  Transportation Needs: No Transportation Needs (12/21/2023)   PRAPARE - Administrator, Civil Service (Medical): No    Lack of Transportation (Non-Medical): No  Physical Activity: Insufficiently Active (12/21/2023)   Exercise Vital Sign    Days of Exercise per Week: 2 days    Minutes of Exercise per Session: 50 min  Stress: No Stress Concern Present (12/21/2023)   [employer] of Occupational Health - Occupational Stress Questionnaire    Feeling of Stress : Not at all  Social Connections: Socially Integrated (12/21/2023)   Social Connection and Isolation Panel [NHANES]    Frequency of Communication with Friends and Family: More than three times a week    Frequency of Social Gatherings with Friends and Family: More than three times a week    Attends Religious Services: More  than 4 times per year    Active Member of [employer] or Organizations: Yes    Attends Engineer, Structural: More than 4 times per year    Marital Status: Married  Catering Manager Violence: Not At Risk (06/06/2023)   Received from [facility], [facility]   HITS    Over the last 12 months how often did your partner physically hurt you?: Never    Over the last 12 months how often did your partner insult you or talk down to you?: Never    Over the last 12 months how often did your partner threaten you with physical harm?: Never    Over the last 12 months how often did your partner scream or curse at you?: Never    Outpatient Medications Prior to Visit  Medication Sig Dispense Refill   atorvastatin  (LIPITOR) 20 MG tablet TAKE 1 TABLET(20 MG) BY MOUTH DAILY 90 tablet 1   lisinopril  (ZESTRIL ) 10 MG tablet TAKE 1 TABLET(10 MG) BY MOUTH DAILY 90 tablet 0   meclizine  (ANTIVERT ) 25 MG tablet Take 1 tablet (25 mg total) by mouth 3 (three) times daily as needed for dizziness. 30 tablet 0   tadalafil (CIALIS) 5 MG tablet Take 5 mg by mouth daily.     tamsulosin (FLOMAX) 0.4 MG CAPS capsule Take 0.4 mg by mouth 2 (two) times daily.     verapamil  (CALAN -SR) 120 MG CR tablet TAKE 1 TABLET(120 MG) BY MOUTH AT BEDTIME 30 tablet 1   ALPRAZolam  (XANAX ) 0.25 MG tablet TAKE 1 TABLET BY MOUTH 1 time per day AS NEEDED. (Patient not taking: Reported on 12/22/2023) 15 tablet 1   propranolol  (INDERAL ) 10 MG tablet Take 1 tablet (10 mg total) by mouth 2 (two) times daily as needed. (Patient not taking: Reported on 12/22/2023) 180 tablet 0   No facility-administered medications prior to visit.    Allergies  Allergen Reactions   Hydrocodone Nausea Only   Codeine Nausea And Vomiting    Review of Systems  Constitutional:  Negative for appetite change, chills, fatigue and fever.  HENT:  Negative for congestion, dental problem, ear pain and sore throat.   Eyes:  Negative for discharge, redness and visual  disturbance.  Respiratory:  Negative for cough, chest tightness, shortness of breath and wheezing.   Cardiovascular:  Negative for chest pain, palpitations and leg swelling.  Gastrointestinal:  Negative for abdominal pain, blood in stool, diarrhea, nausea and vomiting.  Genitourinary:  Negative for difficulty urinating, dysuria, flank pain, frequency, hematuria and urgency.  Musculoskeletal:  Negative for arthralgias,  back pain, joint swelling, myalgias and neck stiffness.  Skin:  Negative for pallor and rash.  Neurological:  Negative for dizziness, speech difficulty, weakness and headaches.  Hematological:  Negative for adenopathy. Does not bruise/bleed easily.  Psychiatric/Behavioral:  Negative for confusion and sleep disturbance. The patient is not nervous/anxious.     PE;    12/22/2023    1:02 PM 10/31/2023   10:05 AM 08/25/2023    3:14 PM  Vitals with BMI  Height 5' 11 5' 9 5' 10  Weight 212 lbs 213 lbs 2 oz 209 lbs  BMI 29.58 31.46 29.99  Systolic 126 122 882  Diastolic 77 76 68  Pulse 64 71 76    Gen: Alert, well appearing.  Patient is oriented to person, place, time, and situation. AFFECT: pleasant, lucid thought and speech. ENT: Ears: EACs clear, normal epithelium.  TMs with good light reflex and landmarks bilaterally.  Eyes: no injection, icteris, swelling, or exudate.  EOMI, PERRLA. Nose: no drainage or turbinate edema/swelling.  No injection or focal lesion.  Mouth: lips without lesion/swelling.  Oral mucosa pink and moist.  Dentition intact and without obvious caries or gingival swelling.  Oropharynx without erythema, exudate, or swelling.  Neck: supple/nontender.  No LAD, mass, or TM.  Carotid pulses 2+ bilaterally, without bruits. CV: RRR, no m/r/g.   LUNGS: CTA bilat, nonlabored resps, good aeration in all lung fields. ABD: soft, NT, ND, BS normal.  No hepatospenomegaly or mass.  No bruits. EXT: no clubbing, cyanosis, or edema.  Musculoskeletal: no joint swelling,  erythema, warmth, or tenderness.  ROM of all joints intact. Skin - no sores or suspicious lesions or rashes or color changes  Pertinent labs:  Lab Results  Component Value Date   TSH 1.57 07/22/2023   Lab Results  Component Value Date   WBC 3.6 (L) 11/22/2022   HGB 15.8 11/22/2022   HCT 46.7 11/22/2022   MCV 88.4 11/22/2022   PLT 198.0 11/22/2022   Lab Results  Component Value Date   CREATININE 1.15 07/22/2023   BUN 19 07/22/2023   NA 140 07/22/2023   K 4.0 07/22/2023   CL 105 07/22/2023   CO2 25 07/22/2023   Lab Results  Component Value Date   ALT 16 05/28/2023   AST 15 05/28/2023   ALKPHOS 74 05/28/2023   BILITOT 0.8 05/28/2023   Lab Results  Component Value Date   CHOL 112 05/28/2023   Lab Results  Component Value Date   HDL 33.90 (L) 05/28/2023   Lab Results  Component Value Date   LDLCALC 59 05/28/2023   Lab Results  Component Value Date   TRIG 95.0 05/28/2023   Lab Results  Component Value Date   CHOLHDL 3 05/28/2023   Lab Results  Component Value Date   PSA 3.56 11/22/2022   PSA 3.72 11/19/2021   PSA 3.84 09/28/2018  07/08/23 PSA 3.68  ASSESSMENT AND PLAN:   No problem-specific Assessment & Plan notes found for this encounter.  1 health maintenance exam: Reviewed age and gender appropriate health maintenance issues (prudent diet, regular exercise, health risks of tobacco and excessive alcohol, use of seatbelts, fire alarms in home, use of sunscreen).  Also reviewed age and gender appropriate health screening as well as vaccine recommendations. Vaccines: Tdap->declined.  Shingrix->declined.  Flu->UTD. Labs: Fasting health panel and PSA Prostate ca screening: PSA today.  Followed by urol. Colon ca screening: Recall 2027  2) HTN, well controlled on lisinopril  10mg  every day. Lytes/cr today.  3)  HLD, doing well on atorva 20 every day. Lipid and hepatic panel today.  4) PSVT, still intermittent sx's on verapamil  SR 120mg  every day. Gets  benefit from prn use of propranolol  10mg . He has appt this afternoon with EP MD to discuss possible ablation.  An After Visit Summary was printed and given to the patient.  FOLLOW UP:  Return in about 6 months (around 06/20/2024) for routine chronic illness f/u.  Signed:  Gerlene Hockey, MD           12/22/2023

## 2023-12-22 ENCOUNTER — Telehealth: Payer: Self-pay | Admitting: *Deleted

## 2023-12-22 ENCOUNTER — Ambulatory Visit (INDEPENDENT_AMBULATORY_CARE_PROVIDER_SITE_OTHER): Payer: 59 | Admitting: Family Medicine

## 2023-12-22 ENCOUNTER — Encounter: Payer: Self-pay | Admitting: Family Medicine

## 2023-12-22 ENCOUNTER — Encounter: Payer: Self-pay | Admitting: Cardiovascular Disease

## 2023-12-22 ENCOUNTER — Ambulatory Visit: Payer: 59 | Attending: Cardiovascular Disease | Admitting: Cardiovascular Disease

## 2023-12-22 VITALS — BP 126/77 | HR 64 | Ht 71.0 in | Wt 212.0 lb

## 2023-12-22 VITALS — BP 122/80 | HR 71 | Ht 71.0 in | Wt 213.0 lb

## 2023-12-22 DIAGNOSIS — Z0000 Encounter for general adult medical examination without abnormal findings: Secondary | ICD-10-CM

## 2023-12-22 DIAGNOSIS — I10 Essential (primary) hypertension: Secondary | ICD-10-CM

## 2023-12-22 DIAGNOSIS — R002 Palpitations: Secondary | ICD-10-CM

## 2023-12-22 DIAGNOSIS — I471 Supraventricular tachycardia, unspecified: Secondary | ICD-10-CM

## 2023-12-22 DIAGNOSIS — E7800 Pure hypercholesterolemia, unspecified: Secondary | ICD-10-CM | POA: Diagnosis not present

## 2023-12-22 DIAGNOSIS — Z125 Encounter for screening for malignant neoplasm of prostate: Secondary | ICD-10-CM

## 2023-12-22 DIAGNOSIS — R000 Tachycardia, unspecified: Secondary | ICD-10-CM

## 2023-12-22 LAB — CBC WITH DIFFERENTIAL/PLATELET
Basophils Absolute: 0 10*3/uL (ref 0.0–0.1)
Basophils Relative: 0.6 % (ref 0.0–3.0)
Eosinophils Absolute: 0.1 10*3/uL (ref 0.0–0.7)
Eosinophils Relative: 2.3 % (ref 0.0–5.0)
HCT: 47.1 % (ref 39.0–52.0)
Hemoglobin: 15.7 g/dL (ref 13.0–17.0)
Lymphocytes Relative: 20.3 % (ref 12.0–46.0)
Lymphs Abs: 1.2 10*3/uL (ref 0.7–4.0)
MCHC: 33.4 g/dL (ref 30.0–36.0)
MCV: 89.5 fL (ref 78.0–100.0)
Monocytes Absolute: 0.4 10*3/uL (ref 0.1–1.0)
Monocytes Relative: 6.5 % (ref 3.0–12.0)
Neutro Abs: 4.2 10*3/uL (ref 1.4–7.7)
Neutrophils Relative %: 70.3 % (ref 43.0–77.0)
Platelets: 282 10*3/uL (ref 150.0–400.0)
RBC: 5.26 Mil/uL (ref 4.22–5.81)
RDW: 13.2 % (ref 11.5–15.5)
WBC: 6 10*3/uL (ref 4.0–10.5)

## 2023-12-22 LAB — LIPID PANEL
Cholesterol: 129 mg/dL (ref 0–200)
HDL: 33.5 mg/dL — ABNORMAL LOW (ref >39.00)
LDL Cholesterol: 61 mg/dL (ref 0–99)
NonHDL: 95.48
Total CHOL/HDL Ratio: 4
Triglycerides: 174 mg/dL — ABNORMAL HIGH (ref 0.0–149.0)
VLDL: 34.8 mg/dL (ref 0.0–40.0)

## 2023-12-22 LAB — COMPREHENSIVE METABOLIC PANEL
ALT: 18 U/L (ref 0–53)
AST: 17 U/L (ref 0–37)
Albumin: 4.4 g/dL (ref 3.5–5.2)
Alkaline Phosphatase: 89 U/L (ref 39–117)
BUN: 19 mg/dL (ref 6–23)
CO2: 30 meq/L (ref 19–32)
Calcium: 9.1 mg/dL (ref 8.4–10.5)
Chloride: 102 meq/L (ref 96–112)
Creatinine, Ser: 1.2 mg/dL (ref 0.40–1.50)
GFR: 64.1 mL/min (ref >60.00)
Glucose, Bld: 91 mg/dL (ref 70–99)
Potassium: 4.4 meq/L (ref 3.5–5.1)
Sodium: 139 meq/L (ref 135–145)
Total Bilirubin: 0.7 mg/dL (ref 0.2–1.2)
Total Protein: 6.5 g/dL (ref 6.0–8.3)

## 2023-12-22 LAB — PSA: PSA: 4.37 ng/mL — ABNORMAL HIGH (ref 0.10–4.00)

## 2023-12-22 MED ORDER — PROPRANOLOL HCL ER 60 MG PO CP24: 60.0000 mg | ORAL_CAPSULE | Freq: Every day | ORAL | 3 refills | Status: DC

## 2023-12-22 NOTE — Patient Instructions (Signed)
 Health Maintenance, Male  Adopting a healthy lifestyle and getting preventive care are important in promoting health and wellness. Ask your health care provider about:  The right schedule for you to have regular tests and exams.  Things you can do on your own to prevent diseases and keep yourself healthy.  What should I know about diet, weight, and exercise?  Eat a healthy diet    Eat a diet that includes plenty of vegetables, fruits, low-fat dairy products, and lean protein.  Do not eat a lot of foods that are high in solid fats, added sugars, or sodium.  Maintain a healthy weight  Body mass index (BMI) is a measurement that can be used to identify possible weight problems. It estimates body fat based on height and weight. Your health care provider can help determine your BMI and help you achieve or maintain a healthy weight.  Get regular exercise  Get regular exercise. This is one of the most important things you can do for your health. Most adults should:  Exercise for at least 150 minutes each week. The exercise should increase your heart rate and make you sweat (moderate-intensity exercise).  Do strengthening exercises at least twice a week. This is in addition to the moderate-intensity exercise.  Spend less time sitting. Even light physical activity can be beneficial.  Watch cholesterol and blood lipids  Have your blood tested for lipids and cholesterol at 65 years of age, then have this test every 5 years.  You may need to have your cholesterol levels checked more often if:  Your lipid or cholesterol levels are high.  You are older than 65 years of age.  You are at high risk for heart disease.  What should I know about cancer screening?  Many types of cancers can be detected early and may often be prevented. Depending on your health history and family history, you may need to have cancer screening at various ages. This may include screening for:  Colorectal cancer.  Prostate cancer.  Skin cancer.  Lung  cancer.  What should I know about heart disease, diabetes, and high blood pressure?  Blood pressure and heart disease  High blood pressure causes heart disease and increases the risk of stroke. This is more likely to develop in people who have high blood pressure readings or are overweight.  Talk with your health care provider about your target blood pressure readings.  Have your blood pressure checked:  Every 3-5 years if you are 24-52 years of age.  Every year if you are 3 years old or older.  If you are between the ages of 60 and 72 and are a current or former smoker, ask your health care provider if you should have a one-time screening for abdominal aortic aneurysm (AAA).  Diabetes  Have regular diabetes screenings. This checks your fasting blood sugar level. Have the screening done:  Once every three years after age 66 if you are at a normal weight and have a low risk for diabetes.  More often and at a younger age if you are overweight or have a high risk for diabetes.  What should I know about preventing infection?  Hepatitis B  If you have a higher risk for hepatitis B, you should be screened for this virus. Talk with your health care provider to find out if you are at risk for hepatitis B infection.  Hepatitis C  Blood testing is recommended for:  Everyone born from [birth year] through 1967.  Anyone  with known risk factors for hepatitis C.  Sexually transmitted infections (STIs)  You should be screened each year for STIs, including gonorrhea and chlamydia, if:  You are sexually active and are younger than 65 years of age.  You are older than 65 years of age and your health care provider tells you that you are at risk for this type of infection.  Your sexual activity has changed since you were last screened, and you are at increased risk for chlamydia or gonorrhea. Ask your health care provider if you are at risk.  Ask your health care provider about whether you are at high risk for HIV. Your health care provider  may recommend a prescription medicine to help prevent HIV infection. If you choose to take medicine to prevent HIV, you should first get tested for HIV. You should then be tested every 3 months for as long as you are taking the medicine.  Follow these instructions at home:  Alcohol use  Do not drink alcohol if your health care provider tells you not to drink.  If you drink alcohol:  Limit how much you have to 0-2 drinks a day.  Know how much alcohol is in your drink. In the [place] one drink equals one 12 oz bottle of beer (355 mL), one 5 oz glass of wine (148 mL), or one 1 oz glass of hard liquor (44 mL).  Lifestyle  Do not use any products that contain nicotine or tobacco. These products include cigarettes, chewing tobacco, and vaping devices, such as e-cigarettes. If you need help quitting, ask your health care provider.  Do not use street drugs.  Do not share needles.  Ask your health care provider for help if you need support or information about quitting drugs.  General instructions  Schedule regular health, dental, and eye exams.  Stay current with your vaccines.  Tell your health care provider if:  You often feel depressed.  You have ever been abused or do not feel safe at home.  Summary  Adopting a healthy lifestyle and getting preventive care are important in promoting health and wellness.  Follow your health care provider's instructions about healthy diet, exercising, and getting tested or screened for diseases.  Follow your health care provider's instructions on monitoring your cholesterol and blood pressure.  This information is not intended to replace advice given to you by your health care provider. Make sure you discuss any questions you have with your health care provider.  Document Revised: 04/23/2021 Document Reviewed: 04/23/2021  Elsevier Patient Education  2024 [employer].
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
## 2023-12-22 NOTE — Patient Instructions (Signed)
 Medication Instructions:  STOP Verapamil   START Propranolol  ER 60 mg once daily  *If you need a refill on your cardiac medications before your next appointment, please call your pharmacy*   Testing/Procedures: [place] Sleep Study  Your physician has recommended that you have a sleep study. This test records several body functions during sleep, including: brain activity, eye movement, oxygen and carbon dioxide blood levels, heart rate and rhythm, breathing rate and rhythm, the flow of air through your mouth and nose, snoring, body muscle movements, and chest and belly movement.   Follow-Up: At [facility], you and your health needs are our priority.  As part of our continuing mission to provide you with exceptional heart care, we have created designated Provider Care Teams.  These Care Teams include your primary Cardiologist (physician) and Advanced Practice Providers (APPs -  Physician Assistants and Nurse Practitioners) who all work together to provide you with the care you need, when you need it.  We recommend signing up for the patient portal called MyChart.  Sign up information is provided on this After Visit Summary.  MyChart is used to connect with patients for Virtual Visits (Telemedicine).  Patients are able to view lab/test results, encounter notes, upcoming appointments, etc.  Non-urgent messages can be sent to your provider as well.   To learn more about what you can do with MyChart, go to [email or web address].    Your next appointment:   6 month(s)  Provider:   Eulas Furbish, MD

## 2023-12-22 NOTE — Telephone Encounter (Signed)
 DR. NANCEY ORDERED VERNEDA SLEEP STUDY.   Patient agreement reviewed and signed on 12/22/2023.  WatchPAT issued to patient on 12/22/2023 by Niels Jest, CMA. Patient aware to not open the WatchPAT box until contacted with the activation PIN. Patient profile initialized in CloudPAT on 12/22/2023 by NIELS JEST, CMA. Device serial number: [id number]  Please list Reason for Call as Advice Only and type WatchPAT issued to patient in the comment box.

## 2024-01-02 ENCOUNTER — Other Ambulatory Visit: Payer: Self-pay

## 2024-01-02 MED ORDER — ATORVASTATIN CALCIUM 20 MG PO TABS: ORAL_TABLET | ORAL | 1 refills | Status: DC

## 2024-01-03 ENCOUNTER — Other Ambulatory Visit: Payer: Self-pay | Admitting: Family Medicine

## 2024-02-23 ENCOUNTER — Encounter: Payer: Self-pay | Admitting: Cardiovascular Disease
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
## 2024-02-24 ENCOUNTER — Ambulatory Visit: Payer: 59 | Attending: Cardiovascular Disease | Admitting: Cardiovascular Disease

## 2024-02-24 ENCOUNTER — Encounter: Payer: Self-pay | Admitting: Cardiovascular Disease

## 2024-02-24 VITALS — BP 136/58 | HR 65 | Ht 71.0 in | Wt 216.0 lb

## 2024-02-24 DIAGNOSIS — I471 Supraventricular tachycardia, unspecified: Secondary | ICD-10-CM | POA: Diagnosis not present

## 2024-02-24 NOTE — Patient Instructions (Signed)

## 2024-03-02 ENCOUNTER — Ambulatory Visit: Payer: 59 | Admitting: Cardiovascular Disease

## 2024-03-08 ENCOUNTER — Telehealth: Payer: Self-pay

## 2024-03-08 NOTE — Telephone Encounter (Signed)
**Note De-Identified Labria Wos Obfuscation** Ordering provider: Dr Nelly Laurence Associated diagnoses: Somnolence-R40.0 and Snoring-R06.83  WatchPAT PA obtained on 03/08/2024 by Pietro Bonura, Lorelle Formosa, LPN. Authorization: Per the [facility] Provider Portal: Prior Authorization/Notification is not required for the requested service(s). Decision ID #: [id number]  Patient notified of PIN ([phone]) on 03/08/2024 Todd Huff Notification Method: phone.  Phone note routed to covering staff for follow-up.

## 2024-03-15 ENCOUNTER — Other Ambulatory Visit: Payer: Self-pay | Admitting: Family Medicine

## 2024-03-31 ENCOUNTER — Telehealth: Admitting: Physician Assistant

## 2024-03-31 DIAGNOSIS — K219 Gastro-esophageal reflux disease without esophagitis: Secondary | ICD-10-CM

## 2024-03-31 MED ORDER — PANTOPRAZOLE SODIUM 40 MG PO TBEC: 40.0000 mg | DELAYED_RELEASE_TABLET | Freq: Every day | ORAL | 0 refills | Status: DC

## 2024-03-31 NOTE — Patient Instructions (Signed)
 Burgess Caroline, thank you for joining Angelia Kelp, PA-C for today's virtual visit.  While this provider is not your primary care provider (PCP), if your PCP is located in our provider database this encounter information will be shared with them immediately following your visit.   A [facility] MyChart account gives you access to today's visit and all your visits, tests, and labs performed at [facility] " click here if you don't have a [facility] MyChart account or go to [email or web address]  Consent: (Patient) Kalab Camps provided verbal consent for this virtual visit at the beginning of the encounter.  Current Medications:  Current Outpatient Medications:    pantoprazole (PROTONIX) 40 MG tablet, Take 1 tablet (40 mg total) by mouth daily., Disp: 30 tablet, Rfl: 0   ALPRAZolam (XANAX) 0.25 MG tablet, TAKE 1 TABLET BY MOUTH 1 time per day AS NEEDED., Disp: 15 tablet, Rfl: 1   atorvastatin (LIPITOR) 20 MG tablet, TAKE 1 TABLET(20 MG) BY MOUTH DAILY, Disp: 90 tablet, Rfl: 1   lisinopril (ZESTRIL) 10 MG tablet, TAKE 1 TABLET(10 MG) BY MOUTH DAILY, Disp: 90 tablet, Rfl: 2   meclizine (ANTIVERT) 25 MG tablet, Take 1 tablet (25 mg total) by mouth 3 (three) times daily as needed for dizziness., Disp: 30 tablet, Rfl: 0   propranolol (INDERAL) 10 MG tablet, Take 1 tablet (10 mg total) by mouth 2 (two) times daily as needed., Disp: 180 tablet, Rfl: 0   propranolol ER (INDERAL LA) 60 MG 24 hr capsule, Take 1 capsule (60 mg total) by mouth daily., Disp: 90 capsule, Rfl: 3   tadalafil (CIALIS) 5 MG tablet, Take 5 mg by mouth daily., Disp: , Rfl:    tamsulosin (FLOMAX) 0.4 MG CAPS capsule, Take 0.4 mg by mouth 2 (two) times daily., Disp: , Rfl:    Medications ordered in this encounter:  Meds ordered this encounter  Medications   pantoprazole (PROTONIX) 40 MG tablet    Sig: Take 1 tablet (40 mg total) by mouth daily.    Dispense:  30 tablet    Refill:  0    Supervising Provider:    LAMPTEY, PHILIP O [[id number]]     *If you need refills on other medications prior to your next appointment, please contact your pharmacy*  Follow-Up: Call back or seek an in-person evaluation if the symptoms worsen or if the condition fails to improve as anticipated.  [facility] [phone]  Other Instructions GERD in Adults: Diet Changes When you have gastroesophageal reflux disease (GERD), you may need to make changes to your diet. Choosing the right foods can help with your symptoms. Think about working with an expert in healthy eating called a dietitian. They can help you make healthy food choices. What are tips for following this plan? Reading food labels Look for foods that are low in saturated fat. Foods that may help with your symptoms include: Foods with less than 5% of daily value (DV) of fat. Foods with 0 grams of trans fat. Cooking [employer] in ways that don't use a lot of fat. These ways include: Baking. Steaming. Grilling. Broiling. To add flavor, try to use herbs that are low in spice and acidity. Avoid frying your food. Meal planning  Eat small meals often rather than eating 3 large meals each day. Eat your meals slowly in a place where you feel relaxed. If told by your health care provider, avoid: Foods that cause symptoms. Keep a food diary to keep track  of foods that cause symptoms. Alcohol. Drinking a lot of liquid with meals. General instructions For 2-3 hours after you eat, avoid: Bending over. Exercise. Lying down. Chew sugar-free gum after meals. What foods should I eat? Eat a healthy diet. Try to include: Foods with high amounts of fiber. These include: Fruits and vegetables. Whole grains and beans. Low-fat dairy products. Lean meats, fish, and poultry. Egg whites. Foods that cause symptoms in someone else may not cause symptoms for you. Work with your provider to find foods that are safe for you. The items listed above  may not be all the foods and drinks you can have. Talk with a dietitian to learn more. The items listed above may not be a complete list of foods and beverages you can eat and drink. Contact a dietitian for more information. What foods should I avoid? Limiting some of these foods may help with your symptoms. Each person is different. Talk with a dietitian or your provider to help you find the exact foods to avoid. Some of the foods to avoid may include: Fruits Fruits with a lot of acid in them. These may include citrus fruits, such as oranges, grapefruit, pineapple, and lemons. Vegetables Deep-fried vegetables, such as [place] fries. Vegetables, sauces, or toppings made with added fat and vegetables with acid in them. These may include tomatoes and tomato products, chili peppers, onions, garlic, and horseradish. Grains Pastries or quick breads with added fat. Meats and other proteins High-fat meats, such as fatty beef or pork, hot dogs, ribs, ham, sausage, salami, and bacon. Fried meat or protein, such as fried fish and fried chicken. Egg yolks. Fats and oils Butter. Margarine. Shortening. Ghee. Drinks Coffee and other drinks with caffeine in them. Fizzy and sugary drinks, such as soda and energy drinks. Fruit juice made with acidic fruits, such as orange or grapefruit. Tomato juice. Sweets and desserts Chocolate and cocoa. Donuts. Seasonings and condiments Mint, such as peppermint and spearmint. Condiments, herbs, or seasonings that cause symptoms. These may include curry, hot sauce, or vinegar-based salad dressings. The items listed above may not be all the foods and drinks you should avoid. Talk with a dietitian to learn more. Questions to ask your health care provider Changes to your diet and everyday life are often the first steps taken to manage symptoms of GERD. If these changes don't help, talk with your provider about taking medicines. Where to find more information International  Foundation for Gastrointestinal Disorders: aboutgerd.org This information is not intended to replace advice given to you by your health care provider. Make sure you discuss any questions you have with your health care provider. Document Revised: 10/14/2023 Document Reviewed: 04/30/2023 Elsevier Patient Education  2024 Elsevier Inc.   If you have been instructed to have an in-person evaluation today at a local Urgent Care facility, please use the link below. It will take you to a list of all of our available [facility], including address, phone number and hours of operation. Please do not delay care.  [facility]  If you or a family member do not have a primary care provider, use the link below to schedule a visit and establish care. When you choose a [facility] primary care physician or advanced practice provider, you gain a long-term partner in health. Find a Primary Care Provider  Learn more about [facility]'s in-office and virtual care options: [facility] - Get Care Now

## 2024-03-31 NOTE — Progress Notes (Signed)
 Virtual Visit Consent   Todd Huff, you are scheduled for a virtual visit with a [facility] provider today. Just as with appointments in the office, your consent must be obtained to participate. Your consent will be active for this visit and any virtual visit you may have with one of our providers in the next 365 days. If you have a MyChart account, a copy of this consent can be sent to you electronically.  As this is a virtual visit, video technology does not allow for your provider to perform a traditional examination. This may limit your provider's ability to fully assess your condition. If your provider identifies any concerns that need to be evaluated in person or the need to arrange testing (such as labs, EKG, etc.), we will make arrangements to do so. Although advances in technology are sophisticated, we cannot ensure that it will always work on either your end or our end. If the connection with a video visit is poor, the visit may have to be switched to a telephone visit. With either a video or telephone visit, we are not always able to ensure that we have a secure connection.  By engaging in this virtual visit, you consent to the provision of healthcare and authorize for your insurance to be billed (if applicable) for the services provided during this visit. Depending on your insurance coverage, you may receive a charge related to this service.  I need to obtain your verbal consent now. Are you willing to proceed with your visit today? Todd Huff has provided verbal consent on 03/31/2024 for a virtual visit (video or telephone). Todd Loveless, PA-C  Date: 03/31/2024 12:12 PM   Virtual Visit via Video Note   IMargaretann Huff, connected with  Todd Huff  ([id number], 01/07/1961) on 03/31/24 at 12:00 PM EDT by a video-enabled telemedicine application and verified that I am speaking with the correct person using two identifiers.  Location: Patient: Virtual Visit Location  Patient: Mobile Provider: Virtual Visit Location Provider: Home Office   I discussed the limitations of evaluation and management by telemedicine and the availability of in person appointments. The patient expressed understanding and agreed to proceed.    History of Present Illness: Todd Huff is a 63 y.o. who identifies as a male who was assigned male at birth, and is being seen today for heartburn.  HPI: Gastroesophageal Reflux He complains of abdominal pain, belching, coughing and heartburn. He reports no dysphagia, no early satiety, no globus sensation, no nausea, no sore throat, no water brash or no wheezing. This is a new problem. The current episode started more than 1 month ago (increasing over the last couple of months). The problem occurs frequently. The problem has been gradually worsening. The heartburn duration is more than one hour. The heartburn is located in the substernum. The heartburn is of moderate intensity. The heartburn does not wake him from sleep. The heartburn does not limit his activity. The heartburn changes with position. The symptoms are aggravated by caffeine, certain foods and lying down. Pertinent negatives include no fatigue, melena, muscle weakness or weight loss. Risk factors include NSAIDs (Advil PM, but has not taken in last 2 weeks). He has tried a histamine-2 antagonist and a PPI (Pepcid, helps for a couple of hours; Prilosc before Pepcid) for the symptoms. The treatment provided no relief.     Problems:  Patient Active Problem List   Diagnosis Date Noted   SVT (supraventricular tachycardia) (HCC) 02/24/2024   Nonspecific abnormal  electrocardiogram (ECG) (EKG) 06/16/2020   PVC (premature ventricular contraction) 06/16/2020   History of colonic polyps 02/24/2015   LOW BACK PAIN 06/25/2010   GANGLION CYST, WRIST, LEFT 01/04/2010   BENIGN PROSTATIC HYPERTROPHY, HX OF 07/08/2008   Allergic rhinitis 06/23/2008   NEPHROLITHIASIS, HX OF 06/23/2008     Allergies:  Allergies  Allergen Reactions   Hydrocodone Nausea Only   Codeine Nausea And Vomiting   Medications:  Current Outpatient Medications:    pantoprazole (PROTONIX) 40 MG tablet, Take 1 tablet (40 mg total) by mouth daily., Disp: 30 tablet, Rfl: 0   ALPRAZolam (XANAX) 0.25 MG tablet, TAKE 1 TABLET BY MOUTH 1 time per day AS NEEDED., Disp: 15 tablet, Rfl: 1   atorvastatin (LIPITOR) 20 MG tablet, TAKE 1 TABLET(20 MG) BY MOUTH DAILY, Disp: 90 tablet, Rfl: 1   lisinopril (ZESTRIL) 10 MG tablet, TAKE 1 TABLET(10 MG) BY MOUTH DAILY, Disp: 90 tablet, Rfl: 2   meclizine (ANTIVERT) 25 MG tablet, Take 1 tablet (25 mg total) by mouth 3 (three) times daily as needed for dizziness., Disp: 30 tablet, Rfl: 0   propranolol (INDERAL) 10 MG tablet, Take 1 tablet (10 mg total) by mouth 2 (two) times daily as needed., Disp: 180 tablet, Rfl: 0   propranolol ER (INDERAL LA) 60 MG 24 hr capsule, Take 1 capsule (60 mg total) by mouth daily., Disp: 90 capsule, Rfl: 3   tadalafil (CIALIS) 5 MG tablet, Take 5 mg by mouth daily., Disp: , Rfl:    tamsulosin (FLOMAX) 0.4 MG CAPS capsule, Take 0.4 mg by mouth 2 (two) times daily., Disp: , Rfl:   Observations/Objective: Patient is well-developed, well-nourished in no acute distress.  Resting comfortably Head is normocephalic, atraumatic.  No labored breathing.  Speech is clear and coherent with logical content.  Patient is alert and oriented at baseline.    Assessment and Plan: 1. Gastroesophageal reflux disease without esophagitis (Primary) - pantoprazole (PROTONIX) 40 MG tablet; Take 1 tablet (40 mg total) by mouth daily.  Dispense: 30 tablet; Refill: 0  - Worsening despite OTC medications - Add Protonix - Continue Famotidine once daily, can increase to twice daily if needed - Discussed dietary and lifestyle modifications to decrease symptoms (given in AVS as well) - Seek in person evaluation if not improving over next 2 weeks or if  worsening  Follow Up Instructions: I discussed the assessment and treatment plan with the patient. The patient was provided an opportunity to ask questions and all were answered. The patient agreed with the plan and demonstrated an understanding of the instructions.  A copy of instructions were sent to the patient via MyChart unless otherwise noted below.    The patient was advised to call back or seek an in-person evaluation if the symptoms worsen or if the condition fails to improve as anticipated.    Angelia Kelp, PA-C

## 2024-07-03 ENCOUNTER — Other Ambulatory Visit: Payer: Self-pay | Admitting: Family Medicine

## 2024-07-16 ENCOUNTER — Other Ambulatory Visit: Payer: Self-pay | Admitting: Family Medicine

## 2024-07-20 ENCOUNTER — Other Ambulatory Visit: Payer: Self-pay | Admitting: Family Medicine

## 2024-07-20 NOTE — Telephone Encounter (Signed)
 Copied from CRM [id number]. Topic: Clinical - Medication Refill >> Jul 20, 2024 12:58 PM Berneda F wrote: Medication:  atorvastatin  (LIPITOR) 20 MG tablet lisinopril  (ZESTRIL ) 10 MG tablet    Has the patient contacted their pharmacy? Yes (Agent: If no, request that the patient contact the pharmacy for the refill. If patient does not wish to contact the pharmacy document the reason why and proceed with request.) (Agent: If yes, when and what did the pharmacy advise?)  This is the patient's preferred pharmacy:  [id number] - [place], [place] - [address] AT [facility] [address] [place] [address] Phone: [phone] Fax: [phone]   Is this the correct pharmacy for this prescription? Yes If no, delete pharmacy and type the correct one.   Has the prescription been filled recently? No  Is the patient out of the medication? Yes  Has the patient been seen for an appointment in the last year OR does the patient have an upcoming appointment? Yes  Can we respond through MyChart? Yes  Agent: Please be advised that Rx refills may take up to 3 business days. We ask that you follow-up with your pharmacy.

## 2024-08-23 ENCOUNTER — Ambulatory Visit: Payer: Self-pay

## 2024-08-23 NOTE — Telephone Encounter (Signed)
 FYI Only or Action Required?: FYI only for provider.  Patient was last seen in primary care on 03/31/2024 by Vivienne Delon HERO, PA-C.  Called Nurse Triage reporting Abdominal Pain and Heartburn.  Symptoms began several years ago.  Interventions attempted: OTC medications: Prilosec.  Symptoms are: gradually worsening.  Triage Disposition: See PCP Within 2 Weeks  Patient/caregiver understands and will follow disposition?: Yes                             Copied from CRM [id number]. Topic: Clinical - Red Word Triage >> Aug 23, 2024  3:23 PM Robinson H wrote: [facility] that prompted transfer to Nurse Triage: Ongoing stomach issues reoccurring heartburn irritation, stomach pain, has taken 3 14 day doses of Prilosec not sure if it's acid reflux Reason for Disposition  Abdominal pain is a chronic symptom (recurrent or ongoing AND present > 4 weeks)  Answer Assessment - Initial Assessment Questions 1. LOCATION: Where does it hurt?      On top of stomach  2. RADIATION: Does the pain shoot anywhere else? (e.g., chest, back)     Denies radiation  3. ONSET: When did the pain begin? (Minutes, hours or days ago)      Entire adult life, worsening over past several months 4. SUDDEN: Gradual or sudden onset?     Gradual 5. PATTERN Does the pain come and go, or is it constant?     Intensity comes and goes depending on diet and medication  6. SEVERITY: How bad is the pain?  (e.g., Scale 1-10; mild, moderate, or severe)     Rates pain 2-3, states pain is persistent 7. RECURRENT SYMPTOM: Have you ever had this type of stomach pain before? If Yes, ask: When was the last time? and What happened that time?      Yes 8. CAUSE: What do you think is causing the stomach pain? (e.g., gallstones, recent abdominal surgery)     Heartburn/acid reflux 9. RELIEVING/AGGRAVATING FACTORS: What makes it better or worse? (e.g., antacids, bending or twisting motion, bowel  movement)     States he gets heartburn/acid reflux after every time he eats, states symptoms resolve while taking the medication (Prilosec) 10. OTHER SYMPTOMS: Do you have any other symptoms? (e.g., back pain, diarrhea, fever, urination pain, vomiting)     States he wakes up with acid reflux in his throat, denies diarrhea, denies constipation, denies fever, denies lower back pain elevated from baseline  Protocols used: Abdominal Pain - Male-A-AH

## 2024-08-25 ENCOUNTER — Encounter: Payer: Self-pay | Admitting: Family Medicine

## 2024-08-25 ENCOUNTER — Ambulatory Visit: Admitting: Family Medicine

## 2024-08-25 DIAGNOSIS — K219 Gastro-esophageal reflux disease without esophagitis: Secondary | ICD-10-CM | POA: Diagnosis not present

## 2024-08-25 MED ORDER — ATORVASTATIN CALCIUM 20 MG PO TABS: ORAL_TABLET | ORAL | 3 refills | Status: AC

## 2024-08-25 MED ORDER — PANTOPRAZOLE SODIUM 40 MG PO TBEC: 40.0000 mg | DELAYED_RELEASE_TABLET | Freq: Every day | ORAL | 3 refills | Status: DC

## 2024-08-25 NOTE — Progress Notes (Signed)
 OFFICE VISIT  08/25/2024  CC:  Chief Complaint  Patient presents with   Abdominal Discomfort    Off and on for x 3-4 months; c/o GERD, rx'd Protonix  03/31/24 (30,0)    Patient is a 63 y.o. male who presents for acid reflux and abdominal discomfort.  HPI: Todd Huff describes frequent heartburn, rising indigestion up to his throat and sometimes even regurgitation of acid into his mouth.  Can be anytime during the day but is definitely worse at night.  He was seen for a video visit by another provider about 5 months ago for GERD, was started on pantoprazole  40 mg a day.  Continue famotidine 1-2 times a day. The pantoprazole  helps very well but he ran out of it. He says Pepcid no longer helps.  Recently had some upper abdominal soreness but just for a morning. He has no nausea or vomiting, no appetite changes, no change in bowel habits, no dysphagia or odynophagia. He takes an ibuprofen tab for his back an average of 2 days a week.   Drinks an occasional beer.  He has never had an EGD.  Review of systems: No chest pain, no shortness of breath, no flank pain, no blood in urine, no fever, no weight loss.  Past Medical History:  Diagnosis Date   ALLERGIC RHINITIS 06/23/2008   Anxiety    BENIGN PROSTATIC HYPERTROPHY, HX OF 07/08/2008   Blood in stool    noted 12-22 in stool and toilet paper-? hemorrhoid- BRB   Blood transfusion without reported diagnosis    Chronic renal insufficiency, stage 2 (mild)    GFR low 60s   Hypercholesterolemia 09/2018   Recommended statin 09/2018; pt elected to do TLC   Hypertension    Increased prostate specific antigen (PSA) velocity 2019   10/13/18 urol f/u-->surveillance chosen over bx-->f/u urol/rpt PSA 6 mo.   Insomnia    LOW BACK PAIN 06/25/2010   NEPHROLITHIASIS, HX OF 06/23/2008   Palpitations 03/2020   EKGs with PVCs-->cardiol saw him and said no further w/u necessary.   Psoriasis    methotrexat at one point but changed to home UV light  therapy qod and doing well (as of 03/2020)   SVT (supraventricular tachycardia) (HCC)    Vitiligo     Past Surgical History:  Procedure Laterality Date   COLONOSCOPY  2011;2017   2011 adenomatous polyp.  12/29/15 hyperplastic polyps.  Recall 10 yrs.   GANGLION CYST EXCISION Left    wrist    SPERMATOCELECTOMY     TRANSTHORACIC ECHOCARDIOGRAM     07/25/23 NORMAL   ZIO monitoring     07/2023 signif SVT->verap started    Outpatient Medications Prior to Visit  Medication Sig Dispense Refill   ALPRAZolam  (XANAX ) 0.25 MG tablet TAKE 1 TABLET BY MOUTH 1 time per day AS NEEDED. 15 tablet 1   lisinopril  (ZESTRIL ) 10 MG tablet TAKE 1 TABLET(10 MG) BY MOUTH DAILY 90 tablet 2   meclizine  (ANTIVERT ) 25 MG tablet Take 1 tablet (25 mg total) by mouth 3 (three) times daily as needed for dizziness. 30 tablet 0   propranolol  (INDERAL ) 10 MG tablet Take 1 tablet (10 mg total) by mouth 2 (two) times daily as needed. 180 tablet 0   propranolol  ER (INDERAL  LA) 60 MG 24 hr capsule Take 1 capsule (60 mg total) by mouth daily. 90 capsule 3   tadalafil (CIALIS) 5 MG tablet Take 5 mg by mouth daily.     tamsulosin (FLOMAX) 0.4 MG CAPS capsule Take 0.4  mg by mouth 2 (two) times daily.     atorvastatin  (LIPITOR) 20 MG tablet TAKE 1 TABLET(20 MG) BY MOUTH DAILY 30 tablet 0   pantoprazole  (PROTONIX ) 40 MG tablet Take 1 tablet (40 mg total) by mouth daily. (Patient not taking: Reported on 08/25/2024) 30 tablet 0   No facility-administered medications prior to visit.    Allergies  Allergen Reactions   Hydrocodone Nausea Only   Codeine Nausea And Vomiting    Review of Systems  As per HPI  PE:    08/25/2024   10:58 AM 02/24/2024    8:01 AM 12/22/2023    2:41 PM  Vitals with BMI  Height  5' 11 5' 11  Weight 218 lbs 6 oz 216 lbs 213 lbs  BMI  30.14 29.72  Systolic 108 136 877  Diastolic 64 58 80  Pulse 58 65 71     Physical Exam  Gen: Alert, well appearing.  Patient is oriented to person, place, time,  and situation. AFFECT: pleasant, lucid thought and speech. Throat: no erythema CV: RRR, no m/r/g.   LUNGS: CTA bilat, nonlabored resps, good aeration in all lung fields. ABD: soft, NT, ND, BS normal.  No hepatospenomegaly or mass.  No bruits.   LABS:  Last CBC Lab Results  Component Value Date   WBC 6.0 12/22/2023   HGB 15.7 12/22/2023   HCT 47.1 12/22/2023   MCV 89.5 12/22/2023   RDW 13.2 12/22/2023   PLT 282.0 12/22/2023   Last metabolic panel Lab Results  Component Value Date   GLUCOSE 91 12/22/2023   NA 139 12/22/2023   K 4.4 12/22/2023   CL 102 12/22/2023   CO2 30 12/22/2023   BUN 19 12/22/2023   CREATININE 1.20 12/22/2023   GFR 64.10 12/22/2023   CALCIUM  9.1 12/22/2023   PROT 6.5 12/22/2023   ALBUMIN 4.4 12/22/2023   BILITOT 0.7 12/22/2023   ALKPHOS 89 12/22/2023   AST 17 12/22/2023   ALT 18 12/22/2023   IMPRESSION AND PLAN:  GERD, not responsive to H2 blockers or over-the-counter strength PPI. He got good efficacy from 40 mg pantoprazole  daily. Will restart this medication. Discussed potential long-term effects of daily PPI. Also discussed potential long-term problems from uncontrolled reflux.  If he remains on the medication daily then we will start to monitor B12, iron, and magnesium annually, as well as monitor renal function twice a year.  An After Visit Summary was printed and given to the patient.  FOLLOW UP: Return if symptoms worsen or fail to improve.  Signed:  Gerlene Hockey, MD           08/25/2024

## 2024-08-25 NOTE — Patient Instructions (Signed)
 GERD in Adults: Diet Changes When you have gastroesophageal reflux disease (GERD), you may need to make changes to your diet. Choosing the right foods can help with your symptoms. Think about working with an expert in healthy eating called a dietitian. They can help you make healthy food choices. What are tips for following this plan? Reading food labels Look for foods that are low in saturated fat. Foods that may help with your symptoms include: Foods with less than 5% of daily value (DV) of fat. Foods with 0 grams of trans fat. Cooking [employer] in ways that don't use a lot of fat. These ways include: Baking. Steaming. Grilling. Broiling. To add flavor, try to use herbs that are low in spice and acidity. Avoid frying your food. Meal planning  Eat small meals often rather than eating 3 large meals each day. Eat your meals slowly in a place where you feel relaxed. If told by your health care provider, avoid: Foods that cause symptoms. Keep a food diary to keep track of foods that cause symptoms. Alcohol. Drinking a lot of liquid with meals. General instructions For 2-3 hours after you eat, avoid: Bending over. Exercise. Lying down. Chew sugar-free gum after meals. What foods should I eat? Eat a healthy diet. Try to include: Foods with high amounts of fiber. These include: Fruits and vegetables. Whole grains and beans. Low-fat dairy products. Lean meats, fish, and poultry. Egg whites. Foods that cause symptoms in someone else may not cause symptoms for you. Work with your provider to find foods that are safe for you. The items listed above may not be all the foods and drinks you can have. Talk with a dietitian to learn more. The items listed above may not be a complete list of foods and beverages you can eat and drink. Contact a dietitian for more information. What foods should I avoid? Limiting some of these foods may help with your symptoms. Each person is different.  Talk with a dietitian or your provider to help you find the exact foods to avoid. Some of the foods to avoid may include: Fruits Fruits with a lot of acid in them. These may include citrus fruits, such as oranges, grapefruit, pineapple, and lemons. Vegetables Deep-fried vegetables, such as [place] fries. Vegetables, sauces, or toppings made with added fat and vegetables with acid in them. These may include tomatoes and tomato products, chili peppers, onions, garlic, and horseradish. Grains Pastries or quick breads with added fat. Meats and other proteins High-fat meats, such as fatty beef or pork, hot dogs, ribs, ham, sausage, salami, and bacon. Fried meat or protein, such as fried fish and fried chicken. Egg yolks. Fats and oils Butter. Margarine. Shortening. Ghee. Drinks Coffee and other drinks with caffeine in them. Fizzy and sugary drinks, such as soda and energy drinks. Fruit juice made with acidic fruits, such as orange or grapefruit. Tomato juice. Sweets and desserts Chocolate and cocoa. Donuts. Seasonings and condiments Mint, such as peppermint and spearmint. Condiments, herbs, or seasonings that cause symptoms. These may include curry, hot sauce, or vinegar-based salad dressings. The items listed above may not be all the foods and drinks you should avoid. Talk with a dietitian to learn more. Questions to ask your health care provider Changes to your diet and everyday life are often the first steps taken to manage symptoms of GERD. If these changes don't help, talk with your provider about taking medicines. Where to find more information International Foundation for Gastrointestinal Disorders:  aboutgerd.org This information is not intended to replace advice given to you by your health care provider. Make sure you discuss any questions you have with your health care provider. Document Revised: 10/14/2023 Document Reviewed: 04/30/2023 Elsevier Patient Education  2024 [employer].

## 2024-09-15 ENCOUNTER — Other Ambulatory Visit: Payer: Self-pay | Admitting: Family Medicine

## 2024-09-15 ENCOUNTER — Other Ambulatory Visit: Payer: Self-pay | Admitting: Cardiovascular Disease

## 2024-09-15 NOTE — Telephone Encounter (Unsigned)
 Copied from CRM [id number]. Topic: Clinical - Medication Refill >> Sep 15, 2024  5:00 PM Carmen B wrote: Medication: propranolol  ER (INDERAL  LA) 60 MG 24 hr capsule  Has the patient contacted their pharmacy? Yes, out of refills  This is the patient's preferred pharmacy:  [id number] - [place], [place] - [address] AT [facility] OF [address] [place] [address] Phone: [phone] Fax: [phone]  Is this the correct pharmacy for this prescription? Yes  Has the prescription been filled recently? No  Is the patient out of the medication? Yes  Has the patient been seen for an appointment in the last year OR does the patient have an upcoming appointment? Yes  Can we respond through MyChart? Yes  Agent: Please be advised that Rx refills may take up to 3 business days. We ask that you follow-up with your pharmacy.

## 2024-11-18 ENCOUNTER — Other Ambulatory Visit: Payer: Self-pay

## 2024-11-18 ENCOUNTER — Telehealth: Payer: Self-pay | Admitting: Family Medicine

## 2024-11-18 DIAGNOSIS — K219 Gastro-esophageal reflux disease without esophagitis: Secondary | ICD-10-CM

## 2024-11-18 MED ORDER — PANTOPRAZOLE SODIUM 40 MG PO TBEC: 40.0000 mg | DELAYED_RELEASE_TABLET | Freq: Every day | ORAL | 2 refills | Status: AC

## 2024-11-18 NOTE — Telephone Encounter (Unsigned)
 Copied from CRM #[id number]. Topic: Clinical - Medication Refill >> Nov 18, 2024  9:37 AM Deaijah H wrote: Medication: pantoprazole  (PROTONIX ) 40 MG tablet  Has the patient contacted their pharmacy? Yes (Agent: If no, request that the patient contact the pharmacy for the refill. If patient does not wish to contact the pharmacy document the reason why and proceed with request.) ned it renewed (Agent: If yes, when and what did the pharmacy advise?)  This is the patient's preferred pharmacy:  [facility] #[id number] - [place], [place] - [address] AT [facility] OF [address] [place] [address] Phone: [phone] Fax: [phone]   Is this the correct pharmacy for this prescription? Yes If no, delete pharmacy and type the correct one.   Has the prescription been filled recently? No  Is the patient out of the medication? No, will be tomorrow  Has the patient been seen for an appointment in the last year OR does the patient have an upcoming appointment? Yes  Can we respond through MyChart? Yes  Agent: Please be advised that Rx refills may take up to 3 business days. We ask that you follow-up with your pharmacy.

## 2024-11-18 NOTE — Telephone Encounter (Signed)
 Pt advised refill submitted on 08/25/24 however, the pharmacy system cancelled the remaining refills he had. A new prescription was sent, pt advised. He will follow up with the pharmacy.

## 2024-12-02 ENCOUNTER — Other Ambulatory Visit: Payer: Self-pay | Admitting: Family Medicine

## 2024-12-02 NOTE — Telephone Encounter (Unsigned)
 Copied from CRM [id number]. Topic: Clinical - Medication Refill >> Dec 02, 2024  2:14 PM Burnard DEL wrote: Medication: lisinopril  (ZESTRIL ) 10 MG tablet  Has the patient contacted their pharmacy? Yes (Agent: If no, request that the patient contact the pharmacy for the refill. If patient does not wish to contact the pharmacy document the reason why and proceed with request.) (Agent: If yes, when and what did the pharmacy advise?)  This is the patient's preferred pharmacy:  [facility] #[id number] - [place], [place] - [address] AT [facility] OF [address] [place] [address] Phone: [phone] Fax: [phone]    Is this the correct pharmacy for this prescription? Yes If no, delete pharmacy and type the correct one.   Has the prescription been filled recently? No  Is the patient out of the medication? No(few days left)  Has the patient been seen for an appointment in the last year OR does the patient have an upcoming appointment? Yes  Can we respond through MyChart? Yes  Agent: Please be advised that Rx refills may take up to 3 business days. We ask that you follow-up with your pharmacy.

## 2024-12-03 MED ORDER — LISINOPRIL 10 MG PO TABS: ORAL_TABLET | ORAL | 0 refills | Status: DC

## 2024-12-23 ENCOUNTER — Ambulatory Visit (INDEPENDENT_AMBULATORY_CARE_PROVIDER_SITE_OTHER): Payer: 59 | Admitting: Family Medicine

## 2024-12-23 ENCOUNTER — Encounter: Payer: Self-pay | Admitting: Family Medicine

## 2024-12-23 VITALS — BP 126/71 | HR 59 | Temp 97.0°F

## 2024-12-23 DIAGNOSIS — Z23 Encounter for immunization: Secondary | ICD-10-CM

## 2024-12-23 DIAGNOSIS — Z125 Encounter for screening for malignant neoplasm of prostate: Secondary | ICD-10-CM

## 2024-12-23 DIAGNOSIS — E7800 Pure hypercholesterolemia, unspecified: Secondary | ICD-10-CM | POA: Diagnosis not present

## 2024-12-23 DIAGNOSIS — Z0000 Encounter for general adult medical examination without abnormal findings: Secondary | ICD-10-CM

## 2024-12-23 DIAGNOSIS — I10 Essential (primary) hypertension: Secondary | ICD-10-CM | POA: Diagnosis not present

## 2024-12-23 LAB — CBC WITH DIFFERENTIAL/PLATELET
Basophils Absolute: 0.1 K/uL (ref 0.0–0.1)
Basophils Relative: 1.2 % (ref 0.0–3.0)
Eosinophils Absolute: 0.1 K/uL (ref 0.0–0.7)
Eosinophils Relative: 2.7 % (ref 0.0–5.0)
HCT: 43.6 % (ref 39.0–52.0)
Hemoglobin: 14.7 g/dL (ref 13.0–17.0)
Lymphocytes Relative: 24.1 % (ref 12.0–46.0)
Lymphs Abs: 1.2 K/uL (ref 0.7–4.0)
MCHC: 33.7 g/dL (ref 30.0–36.0)
MCV: 87.5 fl (ref 78.0–100.0)
Monocytes Absolute: 0.4 K/uL (ref 0.1–1.0)
Monocytes Relative: 7.5 % (ref 3.0–12.0)
Neutro Abs: 3.2 K/uL (ref 1.4–7.7)
Neutrophils Relative %: 64.5 % (ref 43.0–77.0)
Platelets: 279 K/uL (ref 150.0–400.0)
RBC: 4.98 Mil/uL (ref 4.22–5.81)
RDW: 13.6 % (ref 11.5–15.5)
WBC: 4.9 K/uL (ref 4.0–10.5)

## 2024-12-23 LAB — LIPID PANEL
Cholesterol: 125 mg/dL (ref 28–200)
HDL: 35.2 mg/dL — ABNORMAL LOW
LDL Cholesterol: 68 mg/dL (ref 10–99)
NonHDL: 89.92
Total CHOL/HDL Ratio: 4
Triglycerides: 111 mg/dL (ref 10.0–149.0)
VLDL: 22.2 mg/dL (ref 0.0–40.0)

## 2024-12-23 LAB — COMPREHENSIVE METABOLIC PANEL WITH GFR
ALT: 16 U/L (ref 3–53)
AST: 15 U/L (ref 5–37)
Albumin: 4.1 g/dL (ref 3.5–5.2)
Alkaline Phosphatase: 69 U/L (ref 39–117)
BUN: 22 mg/dL (ref 6–23)
CO2: 32 meq/L (ref 19–32)
Calcium: 8.8 mg/dL (ref 8.4–10.5)
Chloride: 106 meq/L (ref 96–112)
Creatinine, Ser: 1.5 mg/dL (ref 0.40–1.50)
GFR: 48.7 mL/min — ABNORMAL LOW
Glucose, Bld: 106 mg/dL — ABNORMAL HIGH (ref 70–99)
Potassium: 4.4 meq/L (ref 3.5–5.1)
Sodium: 141 meq/L (ref 135–145)
Total Bilirubin: 0.8 mg/dL (ref 0.2–1.2)
Total Protein: 6.1 g/dL (ref 6.0–8.3)

## 2024-12-23 LAB — PSA, MEDICARE: PSA: 4.27 ng/mL — ABNORMAL HIGH (ref 0.10–4.00)

## 2024-12-23 MED ORDER — LISINOPRIL 10 MG PO TABS: ORAL_TABLET | ORAL | 3 refills | Status: AC

## 2024-12-23 MED ORDER — LISINOPRIL 10 MG PO TABS: ORAL_TABLET | ORAL | 3 refills | Status: DC

## 2024-12-23 NOTE — Progress Notes (Signed)
 "     Office Note 12/23/2024  CC:  Chief Complaint  Patient presents with   Annual Exam    Pt is fasting    HPI:  Patient is a 64 y.o. male who is here for annual health maintenance exam and follow-up hypertension and hypercholesterolemia.  Feeling well.   Past Medical History:  Diagnosis Date   ALLERGIC RHINITIS 06/23/2008   Anxiety    Arthritis    BENIGN PROSTATIC HYPERTROPHY, HX OF 07/08/2008   Blood in stool    noted 12-22 in stool and toilet paper-? hemorrhoid- [facility]   Blood transfusion without reported diagnosis    Chronic renal insufficiency, stage 2 (mild)    GFR low 60s   GERD (gastroesophageal reflux disease)    Hypercholesterolemia 09/2018   Recommended statin 09/2018; pt elected to do TLC   Hypertension    Increased prostate specific antigen (PSA) velocity 2019   10/13/18 urol f/u-->surveillance chosen over bx-->f/u urol/rpt PSA 6 mo.   Insomnia    LOW BACK PAIN 06/25/2010   NEPHROLITHIASIS, HX OF 06/23/2008   Palpitations 03/2020   EKGs with PVCs-->cardiol saw him and said no further w/u necessary.   Psoriasis    methotrexat at one point but changed to home UV light therapy qod and doing well (as of 03/2020)   SVT (supraventricular tachycardia)    Vitiligo     Past Surgical History:  Procedure Laterality Date   COLONOSCOPY  2011;2017   2011 adenomatous polyp.  12/29/15 hyperplastic polyps.  Recall 10 yrs.   GANGLION CYST EXCISION Left    wrist    SPERMATOCELECTOMY     TRANSTHORACIC ECHOCARDIOGRAM     07/25/23 NORMAL   ZIO monitoring     07/2023 signif SVT->verap started    Family History  Problem Relation Age of Onset   Colitis Mother    Diverticulosis Mother    Colon polyps Mother    Colon polyps Father    Colon cancer Neg Hx    Esophageal cancer Neg Hx    Rectal cancer Neg Hx    Stomach cancer Neg Hx     Social History   Socioeconomic History   Marital status: Married    Spouse name: Not on file   Number of children: Not on file   Years  of education: Not on file   Highest education level: Bachelor's degree (e.g., BA, AB, BS)  Occupational History   Not on file  Tobacco Use   Smoking status: Never   Smokeless tobacco: Never  Vaping Use   Vaping status: Never Used  Substance and Sexual Activity   Alcohol use: No    Alcohol/week: 0.0 standard drinks of alcohol   Drug use: No   Sexual activity: Not Currently  Other Topics Concern   Not on file  Social History Narrative   Married, 2 grown sons 2 adult daughters.   Quality it trainer for company in [place]   Orig from [place], [place] since his 19s.   No tobacco.   No alcohol.   Social Drivers of Health   Tobacco Use: Low Risk (12/23/2024)   Patient History    Smoking Tobacco Use: Never    Smokeless Tobacco Use: Never    Passive Exposure: Not on file  Financial Resource Strain: Low Risk (12/21/2023)   Overall Financial Resource Strain (CARDIA)    Difficulty of Paying Living Expenses: Not hard at all  Food Insecurity: No Food Insecurity (12/21/2023)   Hunger Vital Sign  Worried About Programme Researcher, Broadcasting/film/video in the Last Year: Never true    Ran Out of Food in the Last Year: Never true  Transportation Needs: No Transportation Needs (12/21/2023)   PRAPARE - Administrator, Civil Service (Medical): No    Lack of Transportation (Non-Medical): No  Physical Activity: Insufficiently Active (12/21/2023)   Exercise Vital Sign    Days of Exercise per Week: 2 days    Minutes of Exercise per Session: 50 min  Stress: No Stress Concern Present (12/21/2023)   [employer] of Occupational Health - Occupational Stress Questionnaire    Feeling of Stress : Not at all  Social Connections: Socially Integrated (12/21/2023)   Social Connection and Isolation Panel    Frequency of Communication with Friends and Family: More than three times a week    Frequency of Social Gatherings with Friends and Family: More than three times a week    Attends Religious Services: More than 4 times  per year    Active Member of [employer] or Organizations: Yes    Attends Engineer, Structural: More than 4 times per year    Marital Status: Married  Catering Manager Violence: Not At Risk (06/06/2023)   Received from [facility]   HITS    Over the last 12 months how often did your partner physically hurt you?: Never    Over the last 12 months how often did your partner insult you or talk down to you?: Never    Over the last 12 months how often did your partner threaten you with physical harm?: Never    Over the last 12 months how often did your partner scream or curse at you?: Never  Depression (PHQ2-9): Low Risk (12/23/2024)   Depression (PHQ2-9)    PHQ-2 Score: 1  Alcohol Screen: Low Risk (12/21/2023)   Alcohol Screen    Last Alcohol Screening Score (AUDIT): 3  Housing: Low Risk (12/21/2023)   Housing Stability Vital Sign    Unable to Pay for Housing in the Last Year: No    Number of Times Moved in the Last Year: 0    Homeless in the Last Year: No  Utilities: Not on file  Health Literacy: Not on file    Outpatient Medications Prior to Visit  Medication Sig Dispense Refill   ALPRAZolam  (XANAX ) 0.25 MG tablet TAKE 1 TABLET BY MOUTH 1 time per day AS NEEDED. 15 tablet 1   atorvastatin  (LIPITOR) 20 MG tablet TAKE 1 TABLET(20 MG) BY MOUTH DAILY 90 tablet 3   meclizine  (ANTIVERT ) 25 MG tablet Take 1 tablet (25 mg total) by mouth 3 (three) times daily as needed for dizziness. 30 tablet 0   pantoprazole  (PROTONIX ) 40 MG tablet Take 1 tablet (40 mg total) by mouth daily. 90 tablet 2   propranolol  (INDERAL ) 10 MG tablet Take 1 tablet (10 mg total) by mouth 2 (two) times daily as needed. 180 tablet 0   propranolol  ER (INDERAL  LA) 60 MG 24 hr capsule TAKE 1 CAPSULE(60 MG) BY MOUTH DAILY 90 capsule 3   tadalafil (CIALIS) 5 MG tablet Take 5 mg by mouth daily.     tamsulosin (FLOMAX) 0.4 MG CAPS capsule Take 0.4 mg by mouth 2 (two) times daily.     lisinopril  (ZESTRIL ) 10 MG tablet TAKE 1  TABLET(10 MG) BY MOUTH DAILY 30 tablet 0   No facility-administered medications prior to visit.    Allergies[1]  Review of Systems  Constitutional:  Negative for appetite  change, chills, fatigue and fever.  HENT:  Negative for congestion, dental problem, ear pain and sore throat.   Eyes:  Negative for discharge, redness and visual disturbance.  Respiratory:  Negative for cough, chest tightness, shortness of breath and wheezing.   Cardiovascular:  Negative for chest pain, palpitations and leg swelling.  Gastrointestinal:  Negative for abdominal pain, blood in stool, diarrhea, nausea and vomiting.  Genitourinary:  Negative for difficulty urinating, dysuria, flank pain, frequency, hematuria and urgency.  Musculoskeletal:  Negative for arthralgias, back pain, joint swelling, myalgias and neck stiffness.  Skin:  Negative for pallor and rash.  Neurological:  Negative for dizziness, speech difficulty, weakness and headaches.  Hematological:  Negative for adenopathy. Does not bruise/bleed easily.  Psychiatric/Behavioral:  Negative for confusion and sleep disturbance. The patient is not nervous/anxious.     PE;    12/23/2024    7:57 AM 08/25/2024   10:58 AM 02/24/2024    8:01 AM  Vitals with BMI  Height   5' 11  Weight  218 lbs 6 oz 216 lbs  BMI   30.14  Systolic 126 108 863  Diastolic 71 64 58  Pulse 59 58 65   Gen: Alert, well appearing.  Patient is oriented to person, place, time, and situation. AFFECT: pleasant, lucid thought and speech. ENT: Ears: EACs clear, normal epithelium.  TMs with good light reflex and landmarks bilaterally.  Eyes: no injection, icteris, swelling, or exudate.  EOMI, PERRLA. Nose: no drainage or turbinate edema/swelling.  No injection or focal lesion.  Mouth: lips without lesion/swelling.  Oral mucosa pink and moist.  Dentition intact and without obvious caries or gingival swelling.  Oropharynx without erythema, exudate, or swelling.  Neck: supple/nontender.   No LAD, mass, or TM.  Carotid pulses 2+ bilaterally, without bruits. CV: RRR, no m/r/g.   LUNGS: CTA bilat, nonlabored resps, good aeration in all lung fields. ABD: soft, NT, ND, BS normal.  No hepatospenomegaly or mass.  No bruits. EXT: no clubbing, cyanosis, or edema.  Musculoskeletal: no joint swelling, erythema, warmth, or tenderness.  ROM of all joints intact. Skin - no sores or suspicious lesions or rashes or color changes  Pertinent labs:  Lab Results  Component Value Date   TSH 1.57 07/22/2023   Lab Results  Component Value Date   WBC 6.0 12/22/2023   HGB 15.7 12/22/2023   HCT 47.1 12/22/2023   MCV 89.5 12/22/2023   PLT 282.0 12/22/2023   Lab Results  Component Value Date   CREATININE 1.20 12/22/2023   BUN 19 12/22/2023   NA 139 12/22/2023   K 4.4 12/22/2023   CL 102 12/22/2023   CO2 30 12/22/2023   Lab Results  Component Value Date   ALT 18 12/22/2023   AST 17 12/22/2023   ALKPHOS 89 12/22/2023   BILITOT 0.7 12/22/2023   Lab Results  Component Value Date   CHOL 129 12/22/2023   Lab Results  Component Value Date   HDL 33.50 (L) 12/22/2023   Lab Results  Component Value Date   LDLCALC 61 12/22/2023   Lab Results  Component Value Date   TRIG 174.0 (H) 12/22/2023   Lab Results  Component Value Date   CHOLHDL 4 12/22/2023   Lab Results  Component Value Date   PSA 4.37 (H) 12/22/2023   PSA 3.56 11/22/2022   PSA 3.72 11/19/2021   ASSESSMENT AND PLAN:   #1 health maintenance exam: Reviewed age and gender appropriate health maintenance issues (prudent diet, regular exercise,  health risks of tobacco and excessive alcohol, use of seatbelts, fire alarms in home, use of sunscreen).  Also reviewed age and gender appropriate health screening as well as vaccine recommendations. Vaccines: Tdap->declined.  Shingrix->declined.  Flu->UTD. Labs: Fasting health panel and PSA Prostate ca screening: PSA today.  Followed by urol. Colon ca screening: Recall  2027  #2 hypertension, well-controlled on lisinopril  10 mg a day. Electrolytes and creatinine monitoring today.  3.  Hypercholesterolemia, doing well on atorvastatin  20 mg a day. Lipid panel and hepatic panel today.  An After Visit Summary was printed and given to the patient.  FOLLOW UP:  Return in about 1 year (around 12/23/2025) for annual CPE (fasting).  Signed:  Gerlene Hockey, MD           12/23/2024     [1]  Allergies Allergen Reactions   Hydrocodone Nausea Only   Codeine Nausea And Vomiting   "

## 2024-12-23 NOTE — Patient Instructions (Signed)
 Health Maintenance, Male  Adopting a healthy lifestyle and getting preventive care are important in promoting health and wellness. Ask your health care provider about:  The right schedule for you to have regular tests and exams.  Things you can do on your own to prevent diseases and keep yourself healthy.  What should I know about diet, weight, and exercise?  Eat a healthy diet    Eat a diet that includes plenty of vegetables, fruits, low-fat dairy products, and lean protein.  Do not eat a lot of foods that are high in solid fats, added sugars, or sodium.  Maintain a healthy weight  Body mass index (BMI) is a measurement that can be used to identify possible weight problems. It estimates body fat based on height and weight. Your health care provider can help determine your BMI and help you achieve or maintain a healthy weight.  Get regular exercise  Get regular exercise. This is one of the most important things you can do for your health. Most adults should:  Exercise for at least 150 minutes each week. The exercise should increase your heart rate and make you sweat (moderate-intensity exercise).  Do strengthening exercises at least twice a week. This is in addition to the moderate-intensity exercise.  Spend less time sitting. Even light physical activity can be beneficial.  Watch cholesterol and blood lipids  Have your blood tested for lipids and cholesterol at 66 years of age, then have this test every 5 years.  You may need to have your cholesterol levels checked more often if:  Your lipid or cholesterol levels are high.  You are older than 66 years of age.  You are at high risk for heart disease.  What should I know about cancer screening?  Many types of cancers can be detected early and may often be prevented. Depending on your health history and family history, you may need to have cancer screening at various ages. This may include screening for:  Colorectal cancer.  Prostate cancer.  Skin cancer.  Lung  cancer.  What should I know about heart disease, diabetes, and high blood pressure?  Blood pressure and heart disease  High blood pressure causes heart disease and increases the risk of stroke. This is more likely to develop in people who have high blood pressure readings or are overweight.  Talk with your health care provider about your target blood pressure readings.  Have your blood pressure checked:  Every 3-5 years if you are 24-52 years of age.  Every year if you are 3 years old or older.  If you are between the ages of 60 and 72 and are a current or former smoker, ask your health care provider if you should have a one-time screening for abdominal aortic aneurysm (AAA).  Diabetes  Have regular diabetes screenings. This checks your fasting blood sugar level. Have the screening done:  Once every three years after age 66 if you are at a normal weight and have a low risk for diabetes.  More often and at a younger age if you are overweight or have a high risk for diabetes.  What should I know about preventing infection?  Hepatitis B  If you have a higher risk for hepatitis B, you should be screened for this virus. Talk with your health care provider to find out if you are at risk for hepatitis B infection.  Hepatitis C  Blood testing is recommended for:  Everyone born from [birth year] through 1967.  Anyone  with known risk factors for hepatitis C.  Sexually transmitted infections (STIs)  You should be screened each year for STIs, including gonorrhea and chlamydia, if:  You are sexually active and are younger than 66 years of age.  You are older than 66 years of age and your health care provider tells you that you are at risk for this type of infection.  Your sexual activity has changed since you were last screened, and you are at increased risk for chlamydia or gonorrhea. Ask your health care provider if you are at risk.  Ask your health care provider about whether you are at high risk for HIV. Your health care provider  may recommend a prescription medicine to help prevent HIV infection. If you choose to take medicine to prevent HIV, you should first get tested for HIV. You should then be tested every 3 months for as long as you are taking the medicine.  Follow these instructions at home:  Alcohol use  Do not drink alcohol if your health care provider tells you not to drink.  If you drink alcohol:  Limit how much you have to 0-2 drinks a day.  Know how much alcohol is in your drink. In the [place] one drink equals one 12 oz bottle of beer (355 mL), one 5 oz glass of wine (148 mL), or one 1 oz glass of hard liquor (44 mL).  Lifestyle  Do not use any products that contain nicotine or tobacco. These products include cigarettes, chewing tobacco, and vaping devices, such as e-cigarettes. If you need help quitting, ask your health care provider.  Do not use street drugs.  Do not share needles.  Ask your health care provider for help if you need support or information about quitting drugs.  General instructions  Schedule regular health, dental, and eye exams.  Stay current with your vaccines.  Tell your health care provider if:  You often feel depressed.  You have ever been abused or do not feel safe at home.  Summary  Adopting a healthy lifestyle and getting preventive care are important in promoting health and wellness.  Follow your health care provider's instructions about healthy diet, exercising, and getting tested or screened for diseases.  Follow your health care provider's instructions on monitoring your cholesterol and blood pressure.  This information is not intended to replace advice given to you by your health care provider. Make sure you discuss any questions you have with your health care provider.  Document Revised: 04/23/2021 Document Reviewed: 04/23/2021  Elsevier Patient Education  2024 [employer].

## 2024-12-24 ENCOUNTER — Ambulatory Visit: Payer: Self-pay | Admitting: Family Medicine

## 2024-12-24 DIAGNOSIS — R7989 Other specified abnormal findings of blood chemistry: Secondary | ICD-10-CM

## 2025-01-06 ENCOUNTER — Other Ambulatory Visit (INDEPENDENT_AMBULATORY_CARE_PROVIDER_SITE_OTHER)

## 2025-01-06 DIAGNOSIS — R7989 Other specified abnormal findings of blood chemistry: Secondary | ICD-10-CM

## 2025-01-06 LAB — BASIC METABOLIC PANEL WITH GFR
BUN: 24 mg/dL — ABNORMAL HIGH (ref 6–23)
CO2: 30 meq/L (ref 19–32)
Calcium: 8.9 mg/dL (ref 8.4–10.5)
Chloride: 105 meq/L (ref 96–112)
Creatinine, Ser: 1.3 mg/dL (ref 0.40–1.50)
GFR: 57.81 mL/min — ABNORMAL LOW
Glucose, Bld: 98 mg/dL (ref 70–99)
Potassium: 4.4 meq/L (ref 3.5–5.1)
Sodium: 140 meq/L (ref 135–145)

## 2025-01-07 ENCOUNTER — Ambulatory Visit: Payer: Self-pay | Admitting: Family Medicine

## 2025-12-26 ENCOUNTER — Encounter: Admitting: Family Medicine
